# Patient Record
Sex: Male | Born: 1945 | Race: White | Hispanic: No | Marital: Single | Smoking: Never smoker
Health system: Southern US, Community
[De-identification: ages and names within clinical notes are randomized; demographics above are authoritative.]

## PROBLEM LIST (undated history)

## (undated) DIAGNOSIS — M8580 Other specified disorders of bone density and structure, unspecified site: Secondary | ICD-10-CM

## (undated) DIAGNOSIS — G629 Polyneuropathy, unspecified: Secondary | ICD-10-CM

## (undated) DIAGNOSIS — U071 COVID-19: Secondary | ICD-10-CM

## (undated) DIAGNOSIS — F419 Anxiety disorder, unspecified: Secondary | ICD-10-CM

## (undated) DIAGNOSIS — O2230 Deep phlebothrombosis in pregnancy, unspecified trimester: Secondary | ICD-10-CM

## (undated) DIAGNOSIS — C4491 Basal cell carcinoma of skin, unspecified: Secondary | ICD-10-CM

## (undated) DIAGNOSIS — F32A Depression, unspecified: Secondary | ICD-10-CM

## (undated) DIAGNOSIS — K219 Gastro-esophageal reflux disease without esophagitis: Secondary | ICD-10-CM

## (undated) DIAGNOSIS — M1990 Unspecified osteoarthritis, unspecified site: Secondary | ICD-10-CM

## (undated) DIAGNOSIS — C210 Malignant neoplasm of anus, unspecified: Secondary | ICD-10-CM

## (undated) DIAGNOSIS — Z85048 Personal history of other malignant neoplasm of rectum, rectosigmoid junction, and anus: Secondary | ICD-10-CM

## (undated) DIAGNOSIS — B20 Human immunodeficiency virus [HIV] disease: Secondary | ICD-10-CM

## (undated) DIAGNOSIS — L299 Pruritus, unspecified: Secondary | ICD-10-CM

## (undated) DIAGNOSIS — G4733 Obstructive sleep apnea (adult) (pediatric): Secondary | ICD-10-CM

## (undated) DIAGNOSIS — A630 Anogenital (venereal) warts: Secondary | ICD-10-CM

## (undated) DIAGNOSIS — I10 Essential (primary) hypertension: Secondary | ICD-10-CM

## (undated) DIAGNOSIS — Z21 Asymptomatic human immunodeficiency virus [HIV] infection status: Secondary | ICD-10-CM

## (undated) DIAGNOSIS — K224 Dyskinesia of esophagus: Secondary | ICD-10-CM

## (undated) DIAGNOSIS — E785 Hyperlipidemia, unspecified: Secondary | ICD-10-CM

## (undated) HISTORY — PX: WISDOM TOOTH EXTRACTION: SHX21

## (undated) HISTORY — PX: COLONOSCOPY: SHX174

## (undated) HISTORY — PX: TONSILLECTOMY: SUR1361

## (undated) HISTORY — PX: ANUS SURGERY: SHX302

## (undated) HISTORY — PX: UPPER GASTROINTESTINAL ENDOSCOPY: SHX188

## (undated) HISTORY — PX: ADENOIDECTOMY: SUR15

---

## 2005-12-05 ENCOUNTER — Emergency Department: Payer: Self-pay | Admitting: Internal Medicine

## 2011-02-07 ENCOUNTER — Emergency Department: Payer: Self-pay | Admitting: Emergency Medicine

## 2011-06-11 DIAGNOSIS — Z85828 Personal history of other malignant neoplasm of skin: Secondary | ICD-10-CM | POA: Insufficient documentation

## 2011-09-24 DIAGNOSIS — A609 Anogenital herpesviral infection, unspecified: Secondary | ICD-10-CM | POA: Insufficient documentation

## 2013-01-30 DIAGNOSIS — R768 Other specified abnormal immunological findings in serum: Secondary | ICD-10-CM | POA: Insufficient documentation

## 2014-04-05 DIAGNOSIS — M26629 Arthralgia of temporomandibular joint, unspecified side: Secondary | ICD-10-CM | POA: Insufficient documentation

## 2014-04-05 DIAGNOSIS — Z23 Encounter for immunization: Secondary | ICD-10-CM | POA: Insufficient documentation

## 2015-03-11 DIAGNOSIS — D649 Anemia, unspecified: Secondary | ICD-10-CM | POA: Insufficient documentation

## 2015-04-17 DIAGNOSIS — G4731 Primary central sleep apnea: Secondary | ICD-10-CM | POA: Insufficient documentation

## 2015-10-16 DIAGNOSIS — R5382 Chronic fatigue, unspecified: Secondary | ICD-10-CM | POA: Insufficient documentation

## 2015-12-03 DIAGNOSIS — C4361 Malignant melanoma of right upper limb, including shoulder: Secondary | ICD-10-CM | POA: Insufficient documentation

## 2017-04-02 ENCOUNTER — Ambulatory Visit (INDEPENDENT_AMBULATORY_CARE_PROVIDER_SITE_OTHER): Payer: Medicare Other | Admitting: Podiatry

## 2017-04-02 ENCOUNTER — Ambulatory Visit (INDEPENDENT_AMBULATORY_CARE_PROVIDER_SITE_OTHER): Payer: Medicare Other

## 2017-04-02 DIAGNOSIS — F32A Depression, unspecified: Secondary | ICD-10-CM | POA: Insufficient documentation

## 2017-04-02 DIAGNOSIS — Z9181 History of falling: Secondary | ICD-10-CM | POA: Diagnosis not present

## 2017-04-02 DIAGNOSIS — M47812 Spondylosis without myelopathy or radiculopathy, cervical region: Secondary | ICD-10-CM | POA: Insufficient documentation

## 2017-04-02 DIAGNOSIS — I10 Essential (primary) hypertension: Secondary | ICD-10-CM | POA: Insufficient documentation

## 2017-04-02 DIAGNOSIS — G5792 Unspecified mononeuropathy of left lower limb: Secondary | ICD-10-CM | POA: Diagnosis not present

## 2017-04-02 DIAGNOSIS — K219 Gastro-esophageal reflux disease without esophagitis: Secondary | ICD-10-CM | POA: Insufficient documentation

## 2017-04-02 DIAGNOSIS — E785 Hyperlipidemia, unspecified: Secondary | ICD-10-CM | POA: Insufficient documentation

## 2017-04-02 DIAGNOSIS — M779 Enthesopathy, unspecified: Secondary | ICD-10-CM

## 2017-04-02 DIAGNOSIS — F329 Major depressive disorder, single episode, unspecified: Secondary | ICD-10-CM | POA: Insufficient documentation

## 2017-04-02 DIAGNOSIS — M8580 Other specified disorders of bone density and structure, unspecified site: Secondary | ICD-10-CM | POA: Insufficient documentation

## 2017-04-02 DIAGNOSIS — G629 Polyneuropathy, unspecified: Secondary | ICD-10-CM | POA: Insufficient documentation

## 2017-04-02 DIAGNOSIS — R2681 Unsteadiness on feet: Secondary | ICD-10-CM | POA: Diagnosis not present

## 2017-04-02 DIAGNOSIS — Z21 Asymptomatic human immunodeficiency virus [HIV] infection status: Secondary | ICD-10-CM | POA: Insufficient documentation

## 2017-04-04 NOTE — Progress Notes (Signed)
   HPI: 71 year old male presenting today as a new patient with a complaint of stinging pain to the lateral side of the left foot that began 2-3 weeks ago. There are no modifying factors noted. He has not done anything to treat the symptoms. He is here for further evaluation and treatment.   No past medical history on file.   Physical Exam: General: The patient is alert and oriented x3 in no acute distress.  Dermatology: Skin is warm, dry and supple bilateral lower extremities. Negative for open lesions or macerations.  Vascular: Palpable pedal pulses bilaterally. No edema or erythema noted. Capillary refill within normal limits.  Neurological: Positive Tinel sign with percussion of the sural nerve of the left foot. Epicritic and protective threshold grossly intact bilaterally.   Musculoskeletal Exam: Range of motion within normal limits to all pedal and ankle joints bilateral. Muscle strength 5/5 in all groups bilateral.   Radiographic Exam:  Normal osseous mineralization. Joint spaces preserved. No fracture/dislocation/boney destruction.    Assessment: - Neuritis left lateral foot - Gait instability bilaterally - History of falls secondary to imbalance   Plan of Care:  - Patient evaluated. X-rays reviewed. - Injection of 0.5 mLs Celestone Soluspan injected into the left lateral foot near the sural nerve. - Appointment with Liliane Channel for [employer] bracing. - Continue taking gabapentin as directed by PCP. - Return to clinic when necessary.   Edrick Kins, DPM [facility]  Dr. Edrick Kins, DPM    [address],  [address]                Office [phone]  Fax [phone]

## 2017-04-07 MED ORDER — BETAMETHASONE SOD PHOS & ACET 6 (3-3) MG/ML IJ SUSP: 3.0000 mg | Freq: Once | INTRAMUSCULAR | Status: AC

## 2017-04-21 ENCOUNTER — Other Ambulatory Visit: Payer: Self-pay | Admitting: Orthotics

## 2017-06-15 ENCOUNTER — Ambulatory Visit: Payer: Medicare Other | Admitting: Podiatry

## 2019-08-24 ENCOUNTER — Ambulatory Visit: Payer: Self-pay | Attending: Internal Medicine

## 2019-08-24 DIAGNOSIS — Z23 Encounter for immunization: Secondary | ICD-10-CM

## 2019-08-24 NOTE — Progress Notes (Signed)
   Covid-19 Vaccination Clinic  Name:  Jordan Cohen    MRN: [id number] DOB: May 11, 1946  08/24/2019  [name] was observed post Covid-19 immunization for 15 minutes without incident. He was provided with Vaccine Information Sheet and instruction to access the V-Safe system.   [name] was instructed to call 911 with any severe reactions post vaccine: Marland Kitchen Difficulty breathing  . Swelling of face and throat  . A fast heartbeat  . A bad rash all over body  . Dizziness and weakness   Immunizations Administered    Name Date Dose VIS Date Route   Pfizer COVID-19 Vaccine 08/24/2019 10:40 AM 0.3 mL 05/19/2019 Intramuscular   Manufacturer: [place]   Lot: [id number]   [place]: [id number]

## 2019-09-18 ENCOUNTER — Ambulatory Visit: Payer: Self-pay | Attending: Internal Medicine

## 2019-09-18 DIAGNOSIS — Z23 Encounter for immunization: Secondary | ICD-10-CM

## 2019-09-18 NOTE — Progress Notes (Signed)
   Covid-19 Vaccination Clinic  Name:  Jordan Cohen    MRN: [id number] DOB: [birth year]  09/18/2019  Mr. Jordan Cohen was observed post Covid-19 immunization for 15 minutes without incident. He was provided with Vaccine Information Sheet and instruction to access the V-Safe system.   Mr. Jordan Cohen was instructed to call 911 with any severe reactions post vaccine: Marland Kitchen Difficulty breathing  . Swelling of face and throat  . A fast heartbeat  . A bad rash all over body  . Dizziness and weakness   Immunizations Administered    Name Date Dose VIS Date Route   Pfizer COVID-19 Vaccine 09/18/2019 10:49 AM 0.3 mL 05/19/2019 Intramuscular   Manufacturer: [place]   Lot: [id number]   [place]: [id number]

## 2019-09-24 ENCOUNTER — Encounter: Payer: Self-pay | Admitting: Emergency Medicine

## 2019-09-24 ENCOUNTER — Other Ambulatory Visit: Payer: Self-pay

## 2019-09-24 ENCOUNTER — Emergency Department
Admission: EM | Admit: 2019-09-24 | Discharge: 2019-09-24 | Disposition: A | Discharge status: Home / Self Care | Payer: Medicare Other | Attending: Emergency Medicine | Admitting: Emergency Medicine

## 2019-09-24 ENCOUNTER — Emergency Department: Payer: Medicare Other

## 2019-09-24 DIAGNOSIS — Z85048 Personal history of other malignant neoplasm of rectum, rectosigmoid junction, and anus: Secondary | ICD-10-CM | POA: Insufficient documentation

## 2019-09-24 DIAGNOSIS — Z85828 Personal history of other malignant neoplasm of skin: Secondary | ICD-10-CM | POA: Insufficient documentation

## 2019-09-24 DIAGNOSIS — I10 Essential (primary) hypertension: Secondary | ICD-10-CM | POA: Insufficient documentation

## 2019-09-24 DIAGNOSIS — R0789 Other chest pain: Secondary | ICD-10-CM | POA: Diagnosis not present

## 2019-09-24 DIAGNOSIS — Z79899 Other long term (current) drug therapy: Secondary | ICD-10-CM | POA: Insufficient documentation

## 2019-09-24 DIAGNOSIS — F419 Anxiety disorder, unspecified: Secondary | ICD-10-CM | POA: Insufficient documentation

## 2019-09-24 DIAGNOSIS — R079 Chest pain, unspecified: Secondary | ICD-10-CM

## 2019-09-24 HISTORY — DX: Asymptomatic human immunodeficiency virus (hiv) infection status: Z21

## 2019-09-24 HISTORY — DX: Human immunodeficiency virus (HIV) disease: B20

## 2019-09-24 HISTORY — DX: Personal history of other malignant neoplasm of rectum, rectosigmoid junction, and anus: Z85.048

## 2019-09-24 LAB — CBC
HCT: 39.4 % (ref 39.0–52.0)
Hemoglobin: 13.2 g/dL (ref 13.0–17.0)
MCH: 32 pg (ref 26.0–34.0)
MCHC: 33.5 g/dL (ref 30.0–36.0)
MCV: 95.4 fL (ref 80.0–100.0)
Platelets: 250 10*3/uL (ref 150–400)
RBC: 4.13 MIL/uL — ABNORMAL LOW (ref 4.22–5.81)
RDW: 12.5 % (ref 11.5–15.5)
WBC: 5.8 10*3/uL (ref 4.0–10.5)
nRBC: 0 % (ref 0.0–0.2)

## 2019-09-24 LAB — BASIC METABOLIC PANEL
Anion gap: 5 (ref 5–15)
BUN: 16 mg/dL (ref 8–23)
CO2: 29 mmol/L (ref 22–32)
Calcium: 8.9 mg/dL (ref 8.9–10.3)
Chloride: 102 mmol/L (ref 98–111)
Creatinine, Ser: 1.21 mg/dL (ref 0.61–1.24)
GFR calc Af Amer: >60 mL/min (ref >60)
GFR calc non Af Amer: 60 mL/min — ABNORMAL LOW (ref >60)
Glucose, Bld: 88 mg/dL (ref 70–99)
Potassium: 4.9 mmol/L (ref 3.5–5.1)
Sodium: 136 mmol/L (ref 135–145)

## 2019-09-24 LAB — TROPONIN I (HIGH SENSITIVITY)
Troponin I (High Sensitivity): 3 ng/L (ref <18)
Troponin I (High Sensitivity): 3 ng/L (ref <18)

## 2019-09-24 MED ORDER — LORAZEPAM 0.5 MG PO TABS
0.5000 mg | ORAL_TABLET | Freq: Once | ORAL | Status: AC
  Administered 2019-09-24: 0.5 mg via ORAL
  Filled 2019-09-24: qty 1

## 2019-09-24 NOTE — ED Triage Notes (Signed)
Here for central chest tightness and palpitations.  Reports feeling like hard to get good breath.  Unlabored. VSS. No cough or fever.  Is HIV positive.  NAD. Ambulatory.

## 2019-09-24 NOTE — ED Provider Notes (Signed)
[facility] Emergency Department Provider Note    ____________________________________________   I have reviewed the triage vital signs and the nursing notes.   HISTORY  Chief Complaint Chest discomfort  History limited by: Not Limited   HPI Jordan Cohen is a 74 y.o. male who presents to the emergency department today because of concerns for chest discomfort.  Patient describes it as a sensation of an expansion in his chest.  This started this morning.  He says that for the past few nights he has not been able to sleep.  He states that he has been having keep above.  This will occur throughout his body and randomly.  He denies similar symptoms in the past.  Denies any new medications or change in medications.  States he has been eating and drinking his normal amount.   Records reviewed. Per medical record review patient has a history of HTN, HIV  Past Medical History:  Diagnosis Date  . History of anal cancer   . HIV (human immunodeficiency virus infection) [facility])     Patient Active Problem List   Diagnosis Date Noted  . 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 Asymptomatic HIV infection ([place]) 04/02/2017  . Depression 04/02/2017  . GERD (gastroesophageal reflux disease) 04/02/2017  . HTN (hypertension) 04/02/2017  . Hyperlipidemia, unspecified 04/02/2017  . Osteopenia 04/02/2017  . Peripheral neuropathy 04/02/2017  . Melanoma of shoulder, right ([place]) 12/03/2015  . Chronic fatigue, unspecified 10/16/2015  . 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 Anemia, unspecified 03/11/2015  . Needs flu shot 04/05/2014  . Temporomandibular joint (TMJ) pain 04/05/2014  . Helicobacter pylori antibody positive 01/30/2013  . HSV (herpes simplex virus) anogenital infection 09/24/2011  . Personal history of other malignant neoplasm of skin 06/11/2011  . 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 ANUS SURGERY     . TONSILLECTOMY      Prior to Admission medications   Medication Sig Start Date End Date Taking? Authorizing Provider  acyclovir (ZOVIRAX) 400 MG tablet  03/05/17   [provider]  buPROPion (WELLBUTRIN XL) 300 MG 24 hr tablet  03/28/17   [provider]  Calcium Carbonate-Vitamin D (CALCIUM HIGH POTENCY/VITAMIN D) 600-200 MG-UNIT TABS take 1 tablet by mouth twice a day with meals 08/02/13   [provider]  Clobetasol Propionate 0.05 % shampoo [place] SCALP THREE TIMES A WEEK AS DIRECTED 09/17/15   [provider]  diazepam (VALIUM) 5 MG tablet Take by mouth. 03/02/16   [provider]  esomeprazole (NEXIUM) 40 MG capsule take 1 capsule by mouth twice a day 03/08/17   [provider]  gabapentin (NEURONTIN) 300 MG capsule  03/05/17   [provider]  loperamide (IMODIUM) 2 MG capsule Take by mouth. 04/18/13   [provider]  losartan (COZAAR) 50 MG tablet Take by mouth. 09/14/16 09/14/17  [provider]  Multiple Vitamin (MULTIVITAMIN) capsule Take by mouth.    [provider]  pravastatin (PRAVACHOL) 40 MG tablet  03/05/17   [provider]  raltegravir (ISENTRESS) 400 MG tablet take 1 tablet by mouth twice a day 03/23/17   [provider]  sertraline (ZOLOFT) 100 MG tablet  03/21/17   [provider]  TRUVADA 200-300 MG tablet  02/10/17   [provider]  VALSARTAN PO Take by mouth.    [provider]    Allergies Tetracycline, Atorvastatin, and Efavirenz-emtricitab-tenofovir  History reviewed.  No pertinent family history.  Social History Social History   Tobacco Use  . Smoking status: Never Smoker  . Smokeless tobacco: Never Used  Substance Use Topics  . Alcohol use: Never  . Drug use: Never    Review of Systems Constitutional: No fever/chills Eyes: No visual changes. ENT: No sore throat. Cardiovascular: Positive for chest discomfort.   Respiratory: Denies shortness of breath. Gastrointestinal: No abdominal pain.  No nausea, no vomiting.  No diarrhea.   Genitourinary: Negative for dysuria. Musculoskeletal: Negative for back pain. Skin: Negative for rash. Neurological: Positive for spasms.  ____________________________________________   PHYSICAL EXAM:  VITAL SIGNS: ED Triage Vitals  Enc Vitals Group     BP 09/24/19 0737 136/82     Pulse Rate 09/24/19 0737 83     Resp 09/24/19 0737 16     Temp 09/24/19 0737 97.7 F (36.5 C)     Temp Source 09/24/19 0737 Oral     SpO2 09/24/19 0737 100 %     Weight 09/24/19 0738 180 lb (81.6 kg)     Height 09/24/19 0738 5\' 7"  (1.702 m)     Head Circumference --      Peak Flow --      Pain Score 09/24/19 0738 5   Constitutional: Alert and oriented.  Eyes: Conjunctivae are normal.  ENT      Head: Normocephalic and atraumatic.      Nose: No congestion/rhinnorhea.      Mouth/Throat: Mucous membranes are moist.      Neck: No stridor. Hematological/Lymphatic/Immunilogical: No cervical lymphadenopathy. Cardiovascular: Normal rate, regular rhythm.  No murmurs, rubs, or gallops.  Respiratory: Normal respiratory effort without tachypnea nor retractions. Breath sounds are clear and equal bilaterally. No wheezes/rales/rhonchi. Gastrointestinal: Soft and non tender. No rebound. No guarding.  Genitourinary: Deferred Musculoskeletal: Normal range of motion in all extremities. No lower extremity edema. Neurologic:  Normal speech and language. No gross focal neurologic deficits are appreciated.  Skin:  Skin is warm, dry and intact. No rash noted. Psychiatric: Mood and affect are normal. Speech and behavior are normal. Patient exhibits appropriate insight and judgment.  ____________________________________________    LABS (pertinent positives/negatives)  Trop hs 3 x 2 BMP na 136, k 4.9, glu 88 CBC wbc 5.8, hgb 13.2, plt  250  ____________________________________________   EKG  I, Nance Pear, attending physician, personally viewed and interpreted this EKG  EKG Time: 0733 Rate: 85 Rhythm: normal sinus rhythm Axis: normal Intervals: qtc 461 QRS: narrow, LVH ST changes: no st elevation Impression: abnormal ekg   ____________________________________________    RADIOLOGY  CXR No acute abnormality  ____________________________________________   PROCEDURES  Procedures  ____________________________________________   INITIAL IMPRESSION / ASSESSMENT AND PLAN / ED COURSE  Pertinent labs & imaging results that were available during my care of the patient were reviewed by me and considered in my medical decision making (see chart for details).   Patient presented to the emergency department today because of concern for chest discomfort which he describes an expansion of his chest.  The patient states he also has not been sleeping well the past 2 nights.  Work-up with negative troponins x2.  At this point I think ACS unlikely.  Chest x-ray without pneumonia or pneumothorax.  Doubt PE or dissection given nature of patient's pain and reassuring vital signs and physical exam.  He stated he did feel somewhat better after Ativan.  Do wonder if part of patient's presentation is secondary to some anxiety.  He does have  prescription for Valium.  Discussed with patient portance of primary care follow-up.  ____________________________________________   FINAL CLINICAL IMPRESSION(S) / ED DIAGNOSES  Final diagnoses:  Nonspecific chest pain  Anxiety     Note: This dictation was prepared with Dragon dictation. Any transcriptional errors that result from this process are unintentional     Nance Pear, MD 09/24/19 1159

## 2019-09-24 NOTE — Discharge Instructions (Addendum)
Please take the valium prescribed to you to help you sleep. Please seek medical attention for any high fevers, chest pain, shortness of breath, change in behavior, persistent vomiting, bloody stool or any other new or concerning symptoms.

## 2019-09-24 NOTE — ED Notes (Addendum)
Pt presents to the ED c/o "spasms." Pt states they started 2 days ago. Pt states, "it feels like my nerves are moving." Pt also c/o centralized chest pain that started this am but denies SOB or NV. Pt states he can't sleep at night because of it.

## 2019-09-24 NOTE — ED Notes (Signed)
Pt ambulatory to restroom without assistance. Urine sample collected.

## 2021-07-02 ENCOUNTER — Encounter: Payer: Self-pay | Admitting: Internal Medicine

## 2021-07-02 ENCOUNTER — Encounter
Admission: RE | Disposition: A | Discharge status: Home / Self Care | Payer: Self-pay | Source: Home / Self Care | Attending: Internal Medicine

## 2021-07-02 ENCOUNTER — Other Ambulatory Visit: Payer: Self-pay

## 2021-07-02 ENCOUNTER — Ambulatory Visit: Payer: Medicare Other | Admitting: Anesthesiology

## 2021-07-02 ENCOUNTER — Ambulatory Visit
Admission: RE | Admit: 2021-07-02 | Discharge: 2021-07-02 | Disposition: A | Discharge status: Home / Self Care | Payer: Medicare Other | Attending: Internal Medicine | Admitting: Internal Medicine

## 2021-07-02 DIAGNOSIS — F32A Depression, unspecified: Secondary | ICD-10-CM | POA: Insufficient documentation

## 2021-07-02 DIAGNOSIS — Z7901 Long term (current) use of anticoagulants: Secondary | ICD-10-CM | POA: Diagnosis not present

## 2021-07-02 DIAGNOSIS — R000 Tachycardia, unspecified: Secondary | ICD-10-CM | POA: Insufficient documentation

## 2021-07-02 DIAGNOSIS — E782 Mixed hyperlipidemia: Secondary | ICD-10-CM | POA: Diagnosis not present

## 2021-07-02 DIAGNOSIS — I10 Essential (primary) hypertension: Secondary | ICD-10-CM | POA: Insufficient documentation

## 2021-07-02 DIAGNOSIS — M1990 Unspecified osteoarthritis, unspecified site: Secondary | ICD-10-CM | POA: Insufficient documentation

## 2021-07-02 DIAGNOSIS — C210 Malignant neoplasm of anus, unspecified: Secondary | ICD-10-CM

## 2021-07-02 DIAGNOSIS — K219 Gastro-esophageal reflux disease without esophagitis: Secondary | ICD-10-CM | POA: Diagnosis not present

## 2021-07-02 DIAGNOSIS — Z85048 Personal history of other malignant neoplasm of rectum, rectosigmoid junction, and anus: Secondary | ICD-10-CM | POA: Insufficient documentation

## 2021-07-02 DIAGNOSIS — I480 Paroxysmal atrial fibrillation: Secondary | ICD-10-CM | POA: Insufficient documentation

## 2021-07-02 DIAGNOSIS — Z21 Asymptomatic human immunodeficiency virus [HIV] infection status: Secondary | ICD-10-CM | POA: Diagnosis not present

## 2021-07-02 DIAGNOSIS — F419 Anxiety disorder, unspecified: Secondary | ICD-10-CM | POA: Diagnosis not present

## 2021-07-02 HISTORY — PX: CARDIOVERSION: SHX1299

## 2021-07-02 HISTORY — DX: Other specified disorders of bone density and structure, unspecified site: M85.80

## 2021-07-02 HISTORY — DX: Dyskinesia of esophagus: K22.4

## 2021-07-02 HISTORY — DX: Basal cell carcinoma of skin, unspecified: C44.91

## 2021-07-02 HISTORY — DX: Anxiety disorder, unspecified: F41.9

## 2021-07-02 HISTORY — DX: Polyneuropathy, unspecified: G62.9

## 2021-07-02 HISTORY — DX: Gastro-esophageal reflux disease without esophagitis: K21.9

## 2021-07-02 HISTORY — DX: Obstructive sleep apnea (adult) (pediatric): G47.33

## 2021-07-02 HISTORY — DX: Human immunodeficiency virus (HIV) disease: B20

## 2021-07-02 HISTORY — DX: Anogenital (venereal) warts: A63.0

## 2021-07-02 HISTORY — DX: Hyperlipidemia, unspecified: E78.5

## 2021-07-02 HISTORY — DX: Depression, unspecified: F32.A

## 2021-07-02 HISTORY — DX: Malignant neoplasm of anus, unspecified: C21.0

## 2021-07-02 HISTORY — DX: COVID-19: U07.1

## 2021-07-02 HISTORY — DX: Pruritus, unspecified: L29.9

## 2021-07-02 HISTORY — DX: Unspecified osteoarthritis, unspecified site: M19.90

## 2021-07-02 HISTORY — DX: Essential (primary) hypertension: I10

## 2021-07-02 HISTORY — DX: Deep phlebothrombosis in pregnancy, unspecified trimester: O22.30

## 2021-07-02 SURGERY — CARDIOVERSION
Anesthesia: General

## 2021-07-02 MED ORDER — PROPOFOL 10 MG/ML IV BOLUS
INTRAVENOUS | Status: AC
  Filled 2021-07-02: qty 20

## 2021-07-02 MED ORDER — SODIUM CHLORIDE 0.9 % IV SOLN: INTRAVENOUS | Status: DC

## 2021-07-02 MED ORDER — PHENYLEPHRINE HCL-NACL 20-0.9 MG/250ML-% IV SOLN
INTRAVENOUS | Status: AC
  Filled 2021-07-02: qty 250

## 2021-07-02 MED ORDER — PROPOFOL 10 MG/ML IV BOLUS
INTRAVENOUS | Status: DC | PRN
  Administered 2021-07-02: 60 mg via INTRAVENOUS

## 2021-07-02 NOTE — CV Procedure (Signed)
Electrical Cardioversion Procedure Note Jordan Cohen [id number] 07-02-1945  Procedure: Electrical Cardioversion Indications:  Paroxysmal non valvular atrial fibrillation  Procedure Details Consent: Risks of procedure as well as the alternatives and risks of each were explained to the (patient/caregiver).  Consent for procedure obtained. Time Out: Verified patient identification, verified procedure, site/side was marked, verified correct patient position, special equipment/implants available, medications/allergies/relevent history reviewed, required imaging and test results available.  Performed  Patient placed on cardiac monitor, pulse oximetry, supplemental oxygen as necessary.  Sedation given: Propofol and versed as per anesthesia  Pacer pads placed anterior and posterior chest.  Cardioverted 1 time(s).  Cardioverted at 120J.  Evaluation Findings: Post procedure EKG shows: NSR Complications: None Patient did tolerate procedure well.   Jordan Cohen M.D. [facility] 07/02/2021, 7:57 AM

## 2021-07-02 NOTE — Anesthesia Preprocedure Evaluation (Signed)
Anesthesia Evaluation  Patient identified by MRN, date of birth, ID band Patient awake    Reviewed: Allergy & Precautions, NPO status , Patient's Chart, lab work & pertinent test results  History of Anesthesia Complications Negative for: history of anesthetic complications  Airway Mallampati: III  TM Distance: >3 FB Neck ROM: full    Dental  (+) Chipped, Poor Dentition, Missing   Pulmonary neg pulmonary ROS, neg shortness of breath,    Pulmonary exam normal        Cardiovascular Exercise Tolerance: Good hypertension, (-) angina+ dysrhythmias Atrial Fibrillation  Rhythm:irregular Rate:Tachycardia     Neuro/Psych PSYCHIATRIC DISORDERS  Neuromuscular disease    GI/Hepatic Neg liver ROS, GERD  Controlled,  Endo/Other  negative endocrine ROS  Renal/GU negative Renal ROS  negative genitourinary   Musculoskeletal  (+) Arthritis ,   Abdominal   Peds  Hematology negative hematology ROS (+)   Anesthesia Other Findings Past Medical History: No date: Anxiety No date: Depression No date: History of anal cancer No date: HIV (human immunodeficiency virus infection) (HCC) No date: Hyperlipidemia No date: Hypertension  Past Surgical History: No date: ANUS SURGERY No date: TONSILLECTOMY     Reproductive/Obstetrics negative OB ROS                             Anesthesia Physical Anesthesia Plan  ASA: 4  Anesthesia Plan: General   Post-op Pain Management:    Induction: Intravenous  PONV Risk Score and Plan: Propofol infusion and TIVA  Airway Management Planned: Natural Airway and Nasal Cannula  Additional Equipment:   Intra-op Plan:   Post-operative Plan:   Informed Consent: I have reviewed the patients History and Physical, chart, labs and discussed the procedure including the risks, benefits and alternatives for the proposed anesthesia with the patient or authorized representative  who has indicated his/her understanding and acceptance.     Dental Advisory Given  Plan Discussed with: Anesthesiologist, CRNA and Surgeon  Anesthesia Plan Comments: (Patient consented for risks of anesthesia including but not limited to:  - adverse reactions to medications - risk of airway placement if required - damage to eyes, teeth, lips or other oral mucosa - nerve damage due to positioning  - sore throat or hoarseness - Damage to heart, brain, nerves, lungs, other parts of body or loss of life  Patient voiced understanding.)        Anesthesia Quick Evaluation

## 2021-07-02 NOTE — Transfer of Care (Signed)
Immediate Anesthesia Transfer of Care Note  Patient: Jordan Cohen  Procedure(s) Performed: CARDIOVERSION  Patient Location: PACU and special recoveries  Anesthesia Type:General  Level of Consciousness: drowsy and patient cooperative  Airway & Oxygen Therapy: Patient Spontanous Breathing and Patient connected to nasal cannula oxygen  Post-op Assessment: Report given to RN and Post -op Vital signs reviewed and stable  Post vital signs: Reviewed and stable  Last Vitals:  Vitals Value Taken Time  BP 127/80 07/02/21 0735  Temp    Pulse 60 07/02/21 0738  Resp 18 07/02/21 0738  SpO2 98 % 07/02/21 0738    Last Pain:  Vitals:   07/02/21 0717  TempSrc: Oral  PainSc: 0-No pain         Complications: No notable events documented.

## 2021-07-02 NOTE — Anesthesia Procedure Notes (Signed)
Procedure Name: MAC Date/Time: 07/02/2021 7:27 AM Performed by: Jerrye Noble, CRNA Pre-anesthesia Checklist: Patient identified, Emergency Drugs available, Suction available and Patient being monitored Patient Re-evaluated:Patient Re-evaluated prior to induction Oxygen Delivery Method: Nasal cannula

## 2021-07-02 NOTE — Anesthesia Postprocedure Evaluation (Signed)
Anesthesia Post Note  Patient: Adarryl Goldammer  Procedure(s) Performed: CARDIOVERSION  Patient location during evaluation: Cath Lab Anesthesia Type: General Level of consciousness: awake and alert Pain management: pain level controlled Vital Signs Assessment: post-procedure vital signs reviewed and stable Respiratory status: spontaneous breathing, nonlabored ventilation, respiratory function stable and patient connected to nasal cannula oxygen Cardiovascular status: blood pressure returned to baseline and stable Postop Assessment: no apparent nausea or vomiting Anesthetic complications: no   No notable events documented.   Last Vitals:  Vitals:   07/02/21 0800 07/02/21 0810  BP: 124/72 135/84  Pulse: 60 60  Resp: 13 14  Temp:    SpO2: 96% 95%    Last Pain:  Vitals:   07/02/21 0717  TempSrc: Oral  PainSc: 0-No pain                 Precious Haws Betzalel Umbarger

## 2021-07-03 ENCOUNTER — Encounter: Payer: Self-pay | Admitting: Internal Medicine

## 2021-11-24 ENCOUNTER — Emergency Department: Payer: Medicare Other

## 2021-11-24 ENCOUNTER — Encounter: Payer: Self-pay | Admitting: Emergency Medicine

## 2021-11-24 ENCOUNTER — Other Ambulatory Visit: Payer: Self-pay

## 2021-11-24 ENCOUNTER — Ambulatory Visit
Admission: EM | Admit: 2021-11-24 | Discharge: 2021-11-24 | Disposition: A | Discharge status: Home / Self Care | Payer: Medicare Other | Attending: Family Medicine | Admitting: Family Medicine

## 2021-11-24 ENCOUNTER — Emergency Department
Admission: EM | Admit: 2021-11-24 | Discharge: 2021-11-24 | Disposition: A | Discharge status: Home / Self Care | Payer: Medicare Other | Attending: Emergency Medicine | Admitting: Emergency Medicine

## 2021-11-24 DIAGNOSIS — Z21 Asymptomatic human immunodeficiency virus [HIV] infection status: Secondary | ICD-10-CM | POA: Insufficient documentation

## 2021-11-24 DIAGNOSIS — Z79899 Other long term (current) drug therapy: Secondary | ICD-10-CM | POA: Diagnosis not present

## 2021-11-24 DIAGNOSIS — Z7901 Long term (current) use of anticoagulants: Secondary | ICD-10-CM | POA: Insufficient documentation

## 2021-11-24 DIAGNOSIS — R42 Dizziness and giddiness: Secondary | ICD-10-CM | POA: Diagnosis not present

## 2021-11-24 DIAGNOSIS — R519 Headache, unspecified: Secondary | ICD-10-CM | POA: Diagnosis present

## 2021-11-24 DIAGNOSIS — R2681 Unsteadiness on feet: Secondary | ICD-10-CM | POA: Insufficient documentation

## 2021-11-24 DIAGNOSIS — R269 Unspecified abnormalities of gait and mobility: Secondary | ICD-10-CM

## 2021-11-24 DIAGNOSIS — Z85828 Personal history of other malignant neoplasm of skin: Secondary | ICD-10-CM | POA: Insufficient documentation

## 2021-11-24 DIAGNOSIS — R2689 Other abnormalities of gait and mobility: Secondary | ICD-10-CM

## 2021-11-24 DIAGNOSIS — I4891 Unspecified atrial fibrillation: Secondary | ICD-10-CM | POA: Insufficient documentation

## 2021-11-24 DIAGNOSIS — G44209 Tension-type headache, unspecified, not intractable: Secondary | ICD-10-CM

## 2021-11-24 LAB — COMPREHENSIVE METABOLIC PANEL
ALT: 25 U/L (ref 0–44)
AST: 20 U/L (ref 15–41)
Albumin: 4.1 g/dL (ref 3.5–5.0)
Alkaline Phosphatase: 101 U/L (ref 38–126)
Anion gap: 5 (ref 5–15)
BUN: 19 mg/dL (ref 8–23)
CO2: 28 mmol/L (ref 22–32)
Calcium: 8.9 mg/dL (ref 8.9–10.3)
Chloride: 109 mmol/L (ref 98–111)
Creatinine, Ser: 1.01 mg/dL (ref 0.61–1.24)
GFR, Estimated: >60 mL/min (ref >60)
Glucose, Bld: 98 mg/dL (ref 70–99)
Potassium: 3.8 mmol/L (ref 3.5–5.1)
Sodium: 142 mmol/L (ref 135–145)
Total Bilirubin: 0.8 mg/dL (ref 0.3–1.2)
Total Protein: 7.2 g/dL (ref 6.5–8.1)

## 2021-11-24 LAB — CBC
HCT: 40.1 % (ref 39.0–52.0)
Hemoglobin: 13.3 g/dL (ref 13.0–17.0)
MCH: 31.9 pg (ref 26.0–34.0)
MCHC: 33.2 g/dL (ref 30.0–36.0)
MCV: 96.2 fL (ref 80.0–100.0)
Platelets: 261 10*3/uL (ref 150–400)
RBC: 4.17 MIL/uL — ABNORMAL LOW (ref 4.22–5.81)
RDW: 12.7 % (ref 11.5–15.5)
WBC: 6 10*3/uL (ref 4.0–10.5)
nRBC: 0 % (ref 0.0–0.2)

## 2021-11-24 LAB — TROPONIN I (HIGH SENSITIVITY): Troponin I (High Sensitivity): 5 ng/L (ref <18)

## 2021-11-24 LAB — POCT FASTING CBG KUC MANUAL ENTRY: POCT Glucose (KUC): 111 mg/dL — AB (ref 70–99)

## 2021-11-24 MED ORDER — MECLIZINE HCL 25 MG PO TABS
25.0000 mg | ORAL_TABLET | Freq: Once | ORAL | Status: AC
  Administered 2021-11-24: 25 mg via ORAL
  Filled 2021-11-24: qty 1

## 2021-11-24 MED ORDER — ACETAMINOPHEN 500 MG PO TABS
1000.0000 mg | ORAL_TABLET | Freq: Once | ORAL | Status: AC
  Administered 2021-11-24: 1000 mg via ORAL
  Filled 2021-11-24: qty 2

## 2021-11-24 NOTE — ED Provider Notes (Signed)
-----------------------------------------   7:13 PM on 11/24/2021 -----------------------------------------  I took over care of this patient from Dr. Tamala Julian.  The patient has been waiting for MRI since the time of signout at 3 PM.  There are still a couple of patients ahead of him and in line.  At this time, the patient states that he would like to go home.  He denies any acute symptoms at this time.  I had a thorough discussion with him about risks and benefits.  He understands that without obtaining the MRI we cannot fully rule out acute to subacute stroke which can cause permanent disability, although the clinical suspicion is low.  The patient understands he may return at any time if he changes his mind and wishes to resume ED evaluation.  He states he will call his doctor tomorrow to arrange for an outpatient MRI in the next few days.  I gave him return precautions and he expressed understanding   Arta Silence, MD 11/24/21 1914

## 2021-11-24 NOTE — ED Provider Notes (Signed)
[facility] Provider Note    Event Date/Time   First MD Initiated Contact with Patient 11/24/21 1405     (approximate)   History   Dizziness   HPI  Kaylob Wallen is a 76 y.o. male who presents to the ED for evaluation of Dizziness   I reviewed infectious disease visit from 5/23.  History of HIV on Biktarvy.  Eliquis for A-fib.  Previous squamous cell carcinoma of the anus.  Patient presents to the ED for evaluation of 2 or 3 days of balance issues and difficulty walking straight primarily.  He reports "dizziness" but denies presyncope or classic vertigo.  He reports when he is walking, he often has to hold onto things because he finds himself running into the wall and not walking straight.  Denies any vision changes, sensorium changes, focal weakness or fine motor problems such as when using his phone.  He reports bilateral paraspinal cervical stiffness radiating up over his occiput as well as clenching his jaws and is requesting some Tylenol for headache.  Denies any falls or syncopal episodes, fevers, recent illnesses.  He reports all of his symptoms seemed to start after he was trying to move a heavy washing machine around the house that he is working on renovating.   Physical Exam   Triage Vital Signs: ED Triage Vitals [11/24/21 0958]  Enc Vitals Group     BP (!) 145/94     Pulse Rate 70     Resp 18     Temp 98.2 F (36.8 C)     Temp Source Oral     SpO2 96 %     Weight 178 lb (80.7 kg)     Height '5\' 7"'$  (1.702 m)     Head Circumference      Peak Flow      Pain Score 0     Pain Loc      Pain Edu?      Excl. in [place]?     Most recent vital signs: Vitals:   11/24/21 0958  BP: (!) 145/94  Pulse: 70  Resp: 18  Temp: 98.2 F (36.8 C)  SpO2: 96%    General: Awake, no distress.  CV:  Good peripheral perfusion.  Resp:  Normal effort.  Abd:  No distention.  MSK:  No deformity noted.  Neuro:  No focal deficits appreciated. Cranial  nerves II through XII intact 5/5 strength and sensation in all 4 extremities Other:  Well-appearing and conversational.  No meningismus.   ED Results / Procedures / Treatments   Labs (all labs ordered are listed, but only abnormal results are displayed) Labs Reviewed  CBC - Abnormal; Notable for the following components:      Result Value   RBC 4.17 (*)    All other components within normal limits  COMPREHENSIVE METABOLIC PANEL  URINALYSIS, ROUTINE W REFLEX MICROSCOPIC  TROPONIN I (HIGH SENSITIVITY)  TROPONIN I (HIGH SENSITIVITY)    EKG Sinus rhythm, rate of 70 bpm.  Normal axis.  QTc 481.  No STEMI.  RADIOLOGY CT head interpreted by me without evidence of acute intracranial pathology  Official radiology report(s): CT Head Wo Contrast  Result Date: 11/24/2021 CLINICAL DATA:  Headache, dizziness. EXAM: CT HEAD WITHOUT CONTRAST TECHNIQUE: Contiguous axial images were obtained from the base of the skull through the vertex without intravenous contrast. RADIATION DOSE REDUCTION: This exam was performed according to the departmental dose-optimization program which includes automated exposure control, adjustment of the mA  and/or kV according to patient size and/or use of iterative reconstruction technique. COMPARISON:  None Available. FINDINGS: Brain: No evidence of acute infarction, hemorrhage, hydrocephalus, extra-axial collection or mass lesion/mass effect. Vascular: No hyperdense vessel or unexpected calcification. Skull: Normal. Negative for fracture or focal lesion. Sinuses/Orbits: No acute finding. Other: None. IMPRESSION: No acute intracranial abnormality seen. Electronically Signed   By: Marijo Conception M.D.   On: 11/24/2021 10:26    PROCEDURES and INTERVENTIONS:  Procedures  Medications  acetaminophen (TYLENOL) tablet 1,000 mg (has no administration in time range)  meclizine (ANTIVERT) tablet 25 mg (has no administration in time range)     IMPRESSION / MDM / ASSESSMENT AND  PLAN / ED COURSE  I reviewed the triage vital signs and the nursing notes.  Differential diagnosis includes, but is not limited to, stroke, BPPV, tension headache  {Patient presents with symptoms of an acute illness or injury that is potentially life-threatening.  Fairly healthy 76 year old male presents to the ED with a couple days of unsteadiness.  He look systemically well to me and has a reassuring neurologic examination, but slightly stumbles when standing up and reports he has difficulty walking straight.  He reports some dizziness that he has difficulty elaborating upon.  BPPV is a possibility, but I am concerned about the possibility of stroke as well and he is agreeable to stay for an MRI brain to assess for this.  We will treat his headache for Tylenol and his dizziness with meclizine and reassess.  He is signed out to [employer] to follow-up on this study.  If negative then outpatient management may be reasonable.      FINAL CLINICAL IMPRESSION(S) / ED DIAGNOSES   Final diagnoses:  Unsteadiness  Tension headache  Balance problem     Rx / DC Orders   ED Discharge Orders     None        Note:  This document was prepared using Dragon voice recognition software and may include unintentional dictation errors.   Vladimir Crofts, MD 11/24/21 1444

## 2021-11-24 NOTE — ED Notes (Signed)
Patient is being discharged from the Urgent Care and sent to the Emergency Department via personal vehicle . Per Lavell Anchors NP, patient is in need of higher level of care due to dizziness, off balance, jaw clenching. Patient is aware and verbalizes understanding of plan of care.  Vitals:   11/24/21 0936  BP: (!) 158/89  Pulse: 74  Resp: 18  Temp: 98.3 F (36.8 C)

## 2021-11-24 NOTE — ED Provider Notes (Signed)
Jordan Cohen    CSN: [id number] Arrival date & time: 11/24/21  9562      History   Chief Complaint Chief Complaint  Patient presents with   Dizziness   Jaw Pain   Headache    HPI Jordan Cohen is a 76 y.o. male.   HPI Patient with a complex medical history of HIV, proximal atrial fibrillation, presents today with a 3 days history feeling off balanced, confused, dizziness, and generally unwell. He endorses  feel that his "jaws are clinching" without effort. He has had a headache over the last 3 days which he rates pain as a"2". He endorses taking all of his medication as prescribed.  He denies chest pain, shortness of breath, but endorses feeling "off". He is unaccompanied here at [facility] today. He called his primary doctors office and was advised to go to the emergency department for evaluation however did not want to go. Past Medical History:  Diagnosis Date   AIDS (acquired immune deficiency syndrome) ([place])    Anal warts    Anxiety    Arthritis    Basal cell carcinoma    Brachioradial pruritus    COVID-19    Depression    DVT (deep vein thrombosis) in pregnancy    Esophageal spasm    GERD (gastroesophageal reflux disease)    History of anal cancer    HIV (human immunodeficiency virus infection) ([place])    Hyperlipidemia    Hypertension    OSA (obstructive sleep apnea)    Osteopenia    Peripheral neuropathy    Squamous cell carcinoma of anus (HCC)     Patient Active Problem List   Diagnosis Date Noted   Arthritis of neck 04/02/2017   Asymptomatic HIV infection ([place]) 04/02/2017   Depression 04/02/2017   GERD (gastroesophageal reflux disease) 04/02/2017   HTN (hypertension) 04/02/2017   Hyperlipidemia, unspecified 04/02/2017   Osteopenia 04/02/2017   Peripheral neuropathy 04/02/2017   Melanoma of shoulder, right ([place]) 12/03/2015   Chronic fatigue, unspecified 10/16/2015   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   Anemia, unspecified 03/11/2015   Needs flu  shot 04/05/2014   Temporomandibular joint (TMJ) pain 13/01/6577   Helicobacter pylori antibody positive 01/30/2013   HSV (herpes simplex virus) anogenital infection 09/24/2011   Personal history of other malignant neoplasm of skin 06/11/2011   Squamous cell carcinoma of anus ([place]) 07/10/2007    Past Surgical History:  Procedure Laterality Date   ADENOIDECTOMY     ANUS SURGERY     CARDIOVERSION N/A 07/02/2021   Procedure: CARDIOVERSION;  Surgeon: Corey Skains, MD;  Location: [facility] ORS;  Service: Cardiovascular;  Laterality: N/A;   COLONOSCOPY     TONSILLECTOMY     UPPER GASTROINTESTINAL ENDOSCOPY     WISDOM TOOTH EXTRACTION         Home Medications    Prior to Admission medications   Medication Sig Start Date End Date Taking? Authorizing Provider  acyclovir (ZOVIRAX) 400 MG tablet Take 400 mg by mouth daily. 03/05/17   [provider]  albuterol (VENTOLIN HFA) 108 (90 Base) MCG/ACT inhaler Inhale 2 puffs into the lungs every 6 (six) hours as needed for wheezing or shortness of breath.    [provider]  apixaban (ELIQUIS) 5 MG TABS tablet Take 5 mg by mouth 2 (two) times daily.    [provider]  bictegravir-emtricitabine-tenofovir AF (BIKTARVY) 50-200-25 MG TABS tablet Take 1 tablet by mouth daily.    [provider]  buPROPion (WELLBUTRIN XL)  300 MG 24 hr tablet Take 300 mg by mouth daily. 03/28/17   [provider]  cholecalciferol (VITAMIN D3) 25 MCG (1000 UNIT) tablet Take 1,000 Units by mouth daily.    [provider]  diazepam (VALIUM) 10 MG tablet Take 10 mg by mouth daily as needed for anxiety. 03/02/16   [provider]  diltiazem (TIAZAC) 240 MG 24 hr capsule Take 240 mg by mouth daily.    [provider]  esomeprazole (NEXIUM) 40 MG capsule Take 40 mg by mouth 2 (two) times daily before a meal. 03/08/17   [provider]  flecainide (TAMBOCOR) 100 MG tablet Take 100 mg by mouth 2 (two)  times daily.    [provider]  gabapentin (NEURONTIN) 300 MG capsule Take 300-900 mg by mouth See admin instructions. 300 mg in the morning, 900 mg at bedtime 03/05/17   [provider]  loperamide (IMODIUM) 2 MG capsule Take 2 mg by mouth as needed for diarrhea or loose stools. 04/18/13   [provider]  Multiple Vitamins-Minerals (MULTIVITAMIN WITH MINERALS) tablet Take 1 tablet by mouth daily.    [provider]  pravastatin (PRAVACHOL) 40 MG tablet Take 40 mg by mouth daily. 03/05/17   [provider]  Probiotic Product (PROBIOTIC DAILY PO) Take 1 capsule by mouth daily.    [provider]  venlafaxine XR (EFFEXOR-XR) 150 MG 24 hr capsule Take 150 mg by mouth daily with breakfast.    [provider]    Family History History reviewed. No pertinent family history.  Social History Social History   Tobacco Use   Smoking status: Never   Smokeless tobacco: Never  Vaping Use   Vaping Use: Never used  Substance Use Topics   Alcohol use: Never   Drug use: Never     Allergies   Tetracycline, Atorvastatin, and Efavirenz-emtricitab-tenofo df   Review of Systems Review of Systems Pertinent negatives listed in HPI  Physical Exam Triage Vital Signs ED Triage Vitals  Enc Vitals Group     BP      Pulse      Resp      Temp      Temp src      SpO2      Weight      Height      Head Circumference      Peak Flow      Pain Score      Pain Loc      Pain Edu?      Excl. in [place]?    No data found.  Updated Vital Signs BP (!) 158/89 (BP Location: Left Arm)   Pulse 74   Temp 98.3 F (36.8 C) (Oral)   Resp 18   Visual Acuity Right Eye Distance:   Left Eye Distance:   Bilateral Distance:    Right Eye Near:   Left Eye Near:    Bilateral Near:     Physical Exam Constitutional:      Comments: Chronically ill appearing, appears disheveled.  Cardiovascular:     Rate and Rhythm: Normal rate and regular rhythm.   Pulmonary:     Effort: Pulmonary effort is normal.     Breath sounds: Normal breath sounds.  Musculoskeletal:     Cervical back: Normal range of motion.  Skin:    General: Skin is warm.  Neurological:     Mental Status: He is alert and oriented to person, place, and time. Mental status is at  baseline.     Sensory: No sensory deficit.     Motor: No weakness.     Coordination: Coordination abnormal.     Gait: Gait abnormal.  Psychiatric:        Mood and Affect: Mood is anxious.      UC Treatments / Results  Labs (all labs ordered are listed, but only abnormal results are displayed) Labs Reviewed  POCT FASTING CBG [facility] MANUAL ENTRY - Abnormal; Notable for the following components:      Result Value   POCT Glucose ([facility]) 111 (*)    All other components within normal limits    EKG NSR, Rate 73, No acute ST Changes   Radiology No results found.  Procedures Procedures (including critical care time)  Medications Ordered in UC Medications - No data to display  Initial Impression / Assessment and Plan / UC Course  I have reviewed the triage vital signs and the nursing notes.  Pertinent labs & imaging results that were available during my care of the patient were reviewed by me and considered in my medical decision making (see chart for details).    Dizziness and Giddiness, Abnormal Gait Patient has an obvious gait abnormality, however refuses EMS transport to ER for further work-up an evaluation of symptoms. Patient discharged AMA, although voiced that he will go the [facility] for further work-up and evaluation. ECG, NSR RATE 73, blood glucose stable 111, and vitals stable. Final Clinical Impressions(s) / UC Diagnoses   Final diagnoses:  Dizziness and giddiness  Abnormal gait     Discharge Instructions      Go immediately to the emergency department for further work-up and evaluation of the cause of your symptoms. You have refused Ambulance transport therefore you are  traveling at your risk.  Go immediately to the emergency department as indicated at the address indicated above.    ED Prescriptions   None    PDMP not reviewed this encounter.   Scot Jun, FNP 11/24/21 1004

## 2021-11-24 NOTE — Discharge Instructions (Addendum)
Go immediately to the emergency department for further work-up and evaluation of the cause of your symptoms. You have refused Ambulance transport therefore you are traveling at your risk.  Go immediately to the emergency department as indicated at the address indicated above.

## 2021-11-24 NOTE — ED Notes (Addendum)
RN notified MD that pt was ready to leave and not wait for MRI.

## 2021-11-24 NOTE — ED Triage Notes (Signed)
Pt in with co dizziness since Saturday states does feel disoriented at times. NO hx of the same, states head feels tight with headache. Denies any recent illness.

## 2021-11-24 NOTE — ED Triage Notes (Addendum)
Pt presents with dizziness, he is off balance, HA, jaw clenching and upset stomach x 2 days. He called his PCP and advised to go to the ED.

## 2021-11-24 NOTE — ED Notes (Signed)
Pt DC to home. DC instructions reviewed with all questions answered. Pt verbalizes understanding. Pt ambulatory out of dept via ambulation with steady gait

## 2021-11-24 NOTE — Discharge Instructions (Signed)
Your work-up in the ER is reassuring.  Follow-up with your regular doctor as soon as possible.  You may return at any time if you change your mind and wish to proceed with the MRI in the ED.  You should also return immediately if you develop any new or worsening dizziness, unsteadiness on your feet, difficulty walking, weakness or numbness, difficulty with speech or understanding, vision changes, or any other new or worsening symptoms that concern you.

## 2022-08-31 ENCOUNTER — Other Ambulatory Visit: Payer: Self-pay | Admitting: Podiatry

## 2022-08-31 DIAGNOSIS — M85671 Other cyst of bone, right ankle and foot: Secondary | ICD-10-CM

## 2022-09-09 ENCOUNTER — Ambulatory Visit: Payer: Medicare Other

## 2022-09-15 ENCOUNTER — Ambulatory Visit: Admission: RE | Admit: 2022-09-15 | Payer: Medicare Other | Source: Ambulatory Visit

## 2023-01-30 ENCOUNTER — Emergency Department
Admission: EM | Admit: 2023-01-30 | Discharge: 2023-01-30 | Disposition: A | Discharge status: Home / Self Care | Payer: Medicare HMO | Attending: Emergency Medicine | Admitting: Emergency Medicine

## 2023-01-30 ENCOUNTER — Other Ambulatory Visit: Payer: Self-pay

## 2023-01-30 ENCOUNTER — Emergency Department: Payer: Medicare HMO

## 2023-01-30 DIAGNOSIS — Z21 Asymptomatic human immunodeficiency virus [HIV] infection status: Secondary | ICD-10-CM | POA: Insufficient documentation

## 2023-01-30 DIAGNOSIS — Z1152 Encounter for screening for COVID-19: Secondary | ICD-10-CM | POA: Insufficient documentation

## 2023-01-30 DIAGNOSIS — I10 Essential (primary) hypertension: Secondary | ICD-10-CM | POA: Insufficient documentation

## 2023-01-30 DIAGNOSIS — J189 Pneumonia, unspecified organism: Secondary | ICD-10-CM

## 2023-01-30 DIAGNOSIS — R531 Weakness: Secondary | ICD-10-CM

## 2023-01-30 DIAGNOSIS — J168 Pneumonia due to other specified infectious organisms: Secondary | ICD-10-CM | POA: Insufficient documentation

## 2023-01-30 DIAGNOSIS — R509 Fever, unspecified: Secondary | ICD-10-CM | POA: Diagnosis present

## 2023-01-30 LAB — COMPREHENSIVE METABOLIC PANEL
ALT: 22 U/L (ref 0–44)
AST: 21 U/L (ref 15–41)
Albumin: 3.4 g/dL — ABNORMAL LOW (ref 3.5–5.0)
Alkaline Phosphatase: 106 U/L (ref 38–126)
Anion gap: 10 (ref 5–15)
BUN: 22 mg/dL (ref 8–23)
CO2: 28 mmol/L (ref 22–32)
Calcium: 8.6 mg/dL — ABNORMAL LOW (ref 8.9–10.3)
Chloride: 98 mmol/L (ref 98–111)
Creatinine, Ser: 1.12 mg/dL (ref 0.61–1.24)
GFR, Estimated: >60 mL/min (ref >60)
Glucose, Bld: 89 mg/dL (ref 70–99)
Potassium: 4.2 mmol/L (ref 3.5–5.1)
Sodium: 136 mmol/L (ref 135–145)
Total Bilirubin: 0.6 mg/dL (ref 0.3–1.2)
Total Protein: 6.9 g/dL (ref 6.5–8.1)

## 2023-01-30 LAB — CBC WITH DIFFERENTIAL/PLATELET
Abs Immature Granulocytes: 0.03 10*3/uL (ref 0.00–0.07)
Basophils Absolute: 0.1 10*3/uL (ref 0.0–0.1)
Basophils Relative: 1 %
Eosinophils Absolute: 0.3 10*3/uL (ref 0.0–0.5)
Eosinophils Relative: 4 %
HCT: 37.8 % — ABNORMAL LOW (ref 39.0–52.0)
Hemoglobin: 12.4 g/dL — ABNORMAL LOW (ref 13.0–17.0)
Immature Granulocytes: 0 %
Lymphocytes Relative: 21 %
Lymphs Abs: 1.6 10*3/uL (ref 0.7–4.0)
MCH: 31.6 pg (ref 26.0–34.0)
MCHC: 32.8 g/dL (ref 30.0–36.0)
MCV: 96.4 fL (ref 80.0–100.0)
Monocytes Absolute: 0.6 10*3/uL (ref 0.1–1.0)
Monocytes Relative: 8 %
Neutro Abs: 4.9 10*3/uL (ref 1.7–7.7)
Neutrophils Relative %: 66 %
Platelets: 206 10*3/uL (ref 150–400)
RBC: 3.92 MIL/uL — ABNORMAL LOW (ref 4.22–5.81)
RDW: 12.4 % (ref 11.5–15.5)
WBC: 7.5 10*3/uL (ref 4.0–10.5)
nRBC: 0 % (ref 0.0–0.2)

## 2023-01-30 LAB — SARS CORONAVIRUS 2 BY RT PCR: SARS Coronavirus 2 by RT PCR: NEGATIVE

## 2023-01-30 MED ORDER — LACTATED RINGERS IV BOLUS
1000.0000 mL | Freq: Once | INTRAVENOUS | Status: AC
  Administered 2023-01-30: 1000 mL via INTRAVENOUS

## 2023-01-30 MED ORDER — AMOXICILLIN-POT CLAVULANATE 875-125 MG PO TABS: 1.0000 | ORAL_TABLET | Freq: Two times a day (BID) | ORAL | 0 refills | Status: AC

## 2023-01-30 MED ORDER — AMOXICILLIN-POT CLAVULANATE 875-125 MG PO TABS
1.0000 | ORAL_TABLET | Freq: Once | ORAL | Status: AC
  Administered 2023-01-30: 1 via ORAL
  Filled 2023-01-30: qty 1

## 2023-01-30 NOTE — ED Provider Notes (Signed)
[facility] Provider Note    Event Date/Time   First MD Initiated Contact with Patient 01/30/23 1850     (approximate)   History   Chief Complaint Fever and Back Pain   HPI  Jordan Cohen is a 77 y.o. male with past medical history of hypertension, hyperlipidemia, HIV, and anemia who presents to the ED complaining of bodyaches.  Patient reports that he has been feeling weak and malaised with significant fatigue over the past 3 days.  He has felt feverish with chills and has been taking Tylenol daily with partial relief.  He reports some discomfort in his upper back, denies any falls or other trauma.  He has not had any chest pain, cough, shortness of breath, nausea, vomiting, diarrhea, or dysuria.  He is not aware of any sick contacts.  He reports being compliant with his antiretroviral medication, last CD4 count was 311 in June of this year.     Physical Exam   Triage Vital Signs: ED Triage Vitals  Encounter Vitals Group     BP 01/30/23 1839 (!) 146/106     Systolic BP Percentile --      Diastolic BP Percentile --      Pulse Rate 01/30/23 1839 89     Resp 01/30/23 1839 16     Temp 01/30/23 1839 99.3 F (37.4 C)     Temp Source 01/30/23 1839 Oral     SpO2 01/30/23 1839 94 %     Weight --      Height 01/30/23 1842 5\' 7"  (1.702 m)     Head Circumference --      Peak Flow --      Pain Score 01/30/23 1842 0     Pain Loc --      Pain Education --      Exclude from Growth Chart --     Most recent vital signs: Vitals:   01/30/23 2130 01/30/23 2200  BP: (!) 143/68   Pulse: 77 78  Resp:    Temp:    SpO2: 100% 99%    Constitutional: Alert and oriented. Eyes: Conjunctivae are normal. Head: Atraumatic. Nose: No congestion/rhinnorhea. Mouth/Throat: Mucous membranes are moist.  Neck: Supple with no meningismus. Cardiovascular: Normal rate, regular rhythm. Grossly normal heart sounds.  2+ radial pulses bilaterally. Respiratory: Normal  respiratory effort.  No retractions. Lungs CTAB. Gastrointestinal: Soft and nontender. No distention. Musculoskeletal: No lower extremity tenderness nor edema.  Neurologic:  Normal speech and language. No gross focal neurologic deficits are appreciated.    ED Results / Procedures / Treatments   Labs (all labs ordered are listed, but only abnormal results are displayed) Labs Reviewed  CBC WITH DIFFERENTIAL/PLATELET - Abnormal; Notable for the following components:      Result Value   RBC 3.92 (*)    Hemoglobin 12.4 (*)    HCT 37.8 (*)    All other components within normal limits  COMPREHENSIVE METABOLIC PANEL - Abnormal; Notable for the following components:   Calcium 8.6 (*)    Albumin 3.4 (*)    All other components within normal limits  SARS CORONAVIRUS 2 BY RT PCR  URINALYSIS, ROUTINE W REFLEX MICROSCOPIC     EKG  ED ECG REPORT I, Chesley Noon, the attending physician, personally viewed and interpreted this ECG.   Date: 01/30/2023  EKG Time: 20:20  Rate: 70  Rhythm: normal sinus rhythm  Axis: Normal  Intervals:none  ST&T Change: None  RADIOLOGY Chest x-ray reviewed and  interpreted by me with retrocardiac opacity, no edema or effusion noted.  PROCEDURES:  Critical Care performed: No  Procedures   MEDICATIONS ORDERED IN ED: Medications  amoxicillin-clavulanate (AUGMENTIN) 875-125 MG per tablet 1 tablet (has no administration in time range)  lactated ringers bolus 1,000 mL (1,000 mLs Intravenous New Bag/Given 01/30/23 2047)     IMPRESSION / MDM / ASSESSMENT AND PLAN / ED COURSE  I reviewed the triage vital signs and the nursing notes.                              77 y.o. male with past medical history of hypertension, hyperlipidemia, HIV, and anemia who presents to the ED complaining of generalized weakness, fatigue, and bodyaches over the past 3 days.  Patient's presentation is most consistent with acute presentation with potential threat to life or  bodily function.  Differential diagnosis includes, but is not limited to, sepsis, pneumonia, viral syndrome, COVID-19, anemia, electrolyte abnormality, AKI, UTI.  Patient nontoxic-appearing and in no acute distress, vital signs are unremarkable.  Symptoms sound most consistent with viral syndrome, low concern for bacterial illness and sepsis at this time.  However, given his history, we will check chest x-ray and urinalysis in addition to basic labs.  COVID testing is pending at this time, will hydrate with IV fluids and reassess.  COVID testing is negative, however chest x-ray concerning for retrocardiac opacity.  With his reported upper back pain, malaise, and cough I am concerned for pneumonia.  Labs are reassuring with no significant anemia, leukocytosis, lecture abnormality, or AKI.  LFTs are unremarkable.  Patient has been unable to provide urine sample however low suspicion for UTI at this time given no urinary symptoms.  He is appropriate for outpatient management given reassuring vital signs and no signs of sepsis, we will start on Augmentin.  He was counseled to follow-up with his PCP and to return to the ED for new or worsening symptoms, patient agrees with plan.      FINAL CLINICAL IMPRESSION(S) / ED DIAGNOSES   Final diagnoses:  Generalized weakness  Pneumonia due to infectious organism, unspecified laterality, unspecified part of lung     Rx / DC Orders   ED Discharge Orders          Ordered    amoxicillin-clavulanate (AUGMENTIN) 875-125 MG tablet  2 times daily        01/30/23 2221             Note:  This document was prepared using Dragon voice recognition software and may include unintentional dictation errors.   Chesley Noon, MD 01/30/23 2223

## 2023-01-30 NOTE — ED Triage Notes (Signed)
Pt to ed from home via POV for lower back pain and fever. Pt has been feeling bad since Monday. He has woken up in a cold sweat several times this week. Pt is caox4, in no acute distress and ambulatory in triage. Pt states "my tinnitus is louder than normal".

## 2023-01-31 LAB — URINALYSIS, ROUTINE W REFLEX MICROSCOPIC
Bilirubin Urine: NEGATIVE
Glucose, UA: NEGATIVE mg/dL
Hgb urine dipstick: NEGATIVE
Ketones, ur: 5 mg/dL — AB
Leukocytes,Ua: NEGATIVE
Nitrite: NEGATIVE
Protein, ur: NEGATIVE mg/dL
Specific Gravity, Urine: 1.021 (ref 1.005–1.030)
pH: 7 (ref 5.0–8.0)

## 2023-05-11 ENCOUNTER — Emergency Department
Admission: EM | Admit: 2023-05-11 | Discharge: 2023-05-11 | Disposition: A | Discharge status: Home / Self Care | Payer: Medicare HMO | Attending: Emergency Medicine | Admitting: Emergency Medicine

## 2023-05-11 ENCOUNTER — Emergency Department: Payer: Medicare HMO

## 2023-05-11 DIAGNOSIS — S4992XA Unspecified injury of left shoulder and upper arm, initial encounter: Secondary | ICD-10-CM | POA: Diagnosis present

## 2023-05-11 DIAGNOSIS — Z7901 Long term (current) use of anticoagulants: Secondary | ICD-10-CM | POA: Insufficient documentation

## 2023-05-11 DIAGNOSIS — W010XXA Fall on same level from slipping, tripping and stumbling without subsequent striking against object, initial encounter: Secondary | ICD-10-CM | POA: Insufficient documentation

## 2023-05-11 DIAGNOSIS — S42292A Other displaced fracture of upper end of left humerus, initial encounter for closed fracture: Secondary | ICD-10-CM | POA: Diagnosis not present

## 2023-05-11 MED ORDER — HYDROCODONE-ACETAMINOPHEN 5-325 MG PO TABS: 1.0000 | ORAL_TABLET | ORAL | 0 refills | Status: AC | PRN

## 2023-05-11 MED ORDER — HYDROCODONE-ACETAMINOPHEN 5-325 MG PO TABS
1.0000 | ORAL_TABLET | ORAL | Status: AC
  Administered 2023-05-11: 1 via ORAL
  Filled 2023-05-11: qty 1

## 2023-05-11 NOTE — Discharge Instructions (Signed)
Please apply ice 20 minutes every hour to the left shoulder.  Use a sling at all times.  Avoid shoulder range of motion.  Call orthopedic office to schedule follow-up appointment.  Take Norco as needed for moderate to severe pain.

## 2023-05-11 NOTE — ED Triage Notes (Signed)
Pt presents to the ED with left shoulder pain after fall. Pt states that he tripped. Denies hitting head or LOC. Pt A&Ox4 a time of triage. Just complaining of left shoulder pain at this time. Denies other injuries

## 2023-05-11 NOTE — ED Provider Notes (Signed)
[place] EMERGENCY DEPARTMENT AT [facility] Provider Note   CSN: [id number] Arrival date & time: 05/11/23  1533     History  Chief Complaint  Patient presents with   Jordan Cohen is a 77 y.o. male.  Presents to the emergency department for evaluation of left shoulder pain.  Patient was in his shop earlier today, tripped and fell and use his left arm to catch himself.  He developed pain and discomfort to the left proximal humerus.  Denies any head injury LOC nausea vomiting or headache.  Denies any other injury to his body.  HPI     Home Medications Prior to Admission medications   Medication Sig Start Date End Date Taking? Authorizing Provider  HYDROcodone-acetaminophen (NORCO) 5-325 MG tablet Take 1 tablet by mouth every 4 (four) hours as needed for moderate pain (pain score 4-6). 05/11/23  Yes Evon Slack, PA-C  acyclovir (ZOVIRAX) 400 MG tablet Take 400 mg by mouth daily. 03/05/17   [provider]  albuterol (VENTOLIN HFA) 108 (90 Base) MCG/ACT inhaler Inhale 2 puffs into the lungs every 6 (six) hours as needed for wheezing or shortness of breath.    [provider]  apixaban (ELIQUIS) 5 MG TABS tablet Take 5 mg by mouth 2 (two) times daily.    [provider]  bictegravir-emtricitabine-tenofovir AF (BIKTARVY) 50-200-25 MG TABS tablet Take 1 tablet by mouth daily.    [provider]  buPROPion (WELLBUTRIN XL) 300 MG 24 hr tablet Take 300 mg by mouth daily. 03/28/17   [provider]  cholecalciferol (VITAMIN D3) 25 MCG (1000 UNIT) tablet Take 1,000 Units by mouth daily.    [provider]  diazepam (VALIUM) 10 MG tablet Take 10 mg by mouth daily as needed for anxiety. 03/02/16   [provider]  diltiazem (TIAZAC) 240 MG 24 hr capsule Take 240 mg by mouth daily.    [provider]  esomeprazole (NEXIUM) 40 MG capsule Take 40 mg by mouth 2 (two) times daily before a meal. 03/08/17    [provider]  flecainide (TAMBOCOR) 100 MG tablet Take 100 mg by mouth 2 (two) times daily.    [provider]  gabapentin (NEURONTIN) 300 MG capsule Take 300-900 mg by mouth See admin instructions. 300 mg in the morning, 900 mg at bedtime 03/05/17   [provider]  loperamide (IMODIUM) 2 MG capsule Take 2 mg by mouth as needed for diarrhea or loose stools. 04/18/13   [provider]  Multiple Vitamins-Minerals (MULTIVITAMIN WITH MINERALS) tablet Take 1 tablet by mouth daily.    [provider]  pravastatin (PRAVACHOL) 40 MG tablet Take 40 mg by mouth daily. 03/05/17   [provider]  Probiotic Product (PROBIOTIC DAILY PO) Take 1 capsule by mouth daily.    [provider]  venlafaxine XR (EFFEXOR-XR) 150 MG 24 hr capsule Take 150 mg by mouth daily with breakfast.    [provider]      Allergies    Tetracycline, Atorvastatin, and Efavirenz-emtricitab-tenofo df    Review of Systems   Review of Systems  Physical Exam Updated Vital Signs BP (!) 179/93   Pulse 65   Temp 98 F (36.7 C) (Oral)   Resp 18   Ht 5\' 7"  (1.702 m)   Wt 78.5 kg   SpO2 100%   BMI 27.10 kg/m  Physical Exam Constitutional:      Appearance: He is well-developed.  HENT:  Head: Normocephalic and atraumatic.     Right Ear: External ear normal.     Left Ear: External ear normal.  Eyes:     Extraocular Movements: Extraocular movements intact.     Conjunctiva/sclera: Conjunctivae normal.     Pupils: Pupils are equal, round, and reactive to light.  Cardiovascular:     Rate and Rhythm: Normal rate.  Pulmonary:     Effort: Pulmonary effort is normal. No respiratory distress.  Musculoskeletal:        General: Normal range of motion.     Cervical back: Normal range of motion.     Comments: Tender to palpation along left proximal humerus.  No visible defect or palpable defect.  No significant swelling or skin breakdown noted.  Normal range  of motion of the elbow wrist and digits.  Nontender along the cervical thoracic or lumbar spinous process.  Skin:    General: Skin is warm.     Findings: No rash.  Neurological:     General: No focal deficit present.     Mental Status: He is alert and oriented to person, place, and time. Mental status is at baseline.     Cranial Nerves: No cranial nerve deficit.  Psychiatric:        Mood and Affect: Mood normal.        Behavior: Behavior normal.        Thought Content: Thought content normal.     ED Results / Procedures / Treatments   Labs (all labs ordered are listed, but only abnormal results are displayed) Labs Reviewed - No data to display  EKG None  Radiology DG Shoulder Left  Result Date: 05/11/2023 CLINICAL DATA:  Left shoulder pain after fall EXAM: LEFT SHOULDER - 3 VIEW COMPARISON:  None Available. FINDINGS: Comminuted fracture of the proximal humerus through the greater tuberosity with fracture lucency extending obliquely into the proximal humeral diaphysis. No acute dislocation. IMPRESSION: Comminuted fracture of the proximal humerus through the greater tuberosity with fracture lucency extending obliquely into the proximal humeral diaphysis. No substantial displacement of fracture fragments. Electronically Signed   By: Agustin Cree M.D.   On: 05/11/2023 16:59    Procedures Procedures    Medications Ordered in ED Medications  HYDROcodone-acetaminophen (NORCO/VICODIN) 5-325 MG per tablet 1 tablet (has no administration in time range)    ED Course/ Medical Decision Making/ A&P                                 Medical Decision Making Amount and/or Complexity of Data Reviewed Radiology: ordered.  Risk Prescription drug management.   77 year old male with left proximal humerus fracture.  X-rays ordered and independently reviewed by me today show no dislocation.  There is a avulsion fracture along the greater tuberosity with lucency extending along the neck and into  the proximal humeral shaft.  No significant displacement.  Patient is given Norco for pain and will use a sling as needed for comfort.  He will call orthopedic office to schedule follow-up appointment.  He understands signs symptoms return to the ER for. Final Clinical Impression(s) / ED Diagnoses Final diagnoses:  Other closed displaced fracture of proximal end of left humerus, initial encounter    Rx / DC Orders ED Discharge Orders          Ordered    HYDROcodone-acetaminophen (NORCO) 5-325 MG tablet  Every 4 hours PRN  05/11/23 1627              Evon Slack, PA-C 05/11/23 1704    Dionne Bucy, MD 05/11/23 2300

## 2023-06-11 ENCOUNTER — Other Ambulatory Visit: Payer: Self-pay | Admitting: Student

## 2023-06-11 DIAGNOSIS — S42435D Nondisplaced fracture (avulsion) of lateral epicondyle of left humerus, subsequent encounter for fracture with routine healing: Secondary | ICD-10-CM

## 2023-06-16 ENCOUNTER — Encounter: Payer: Self-pay | Admitting: Student

## 2023-06-22 ENCOUNTER — Inpatient Hospital Stay: Admission: RE | Admit: 2023-06-22 | Payer: Medicare HMO | Source: Ambulatory Visit

## 2023-09-28 ENCOUNTER — Other Ambulatory Visit: Payer: Self-pay

## 2023-09-28 MED ORDER — VENLAFAXINE HCL ER 150 MG PO CP24: 150.0000 mg | ORAL_CAPSULE | Freq: Every day | ORAL | 3 refills | Status: AC

## 2023-09-28 MED ORDER — BUPROPION HCL ER (XL) 300 MG PO TB24
300.0000 mg | ORAL_TABLET | Freq: Every day | ORAL | 3 refills | Status: AC
  Filled 2023-10-12: qty 90, 90d supply, fill #0
  Filled 2023-10-15: qty 30, 30d supply, fill #0
  Filled 2023-11-02 – 2023-11-09 (×2): qty 90, 90d supply, fill #0
  Filled 2024-01-28: qty 90, 90d supply, fill #1

## 2023-09-28 MED ORDER — VENLAFAXINE HCL ER 75 MG PO CP24: 75.0000 mg | ORAL_CAPSULE | Freq: Every day | ORAL | 3 refills | Status: AC

## 2023-09-28 MED ORDER — ALBUTEROL SULFATE HFA 108 (90 BASE) MCG/ACT IN AERS: 2.0000 | INHALATION_SPRAY | Freq: Four times a day (QID) | RESPIRATORY_TRACT | 1 refills | Status: AC | PRN

## 2023-09-28 MED ORDER — ACYCLOVIR 400 MG PO TABS
400.0000 mg | ORAL_TABLET | Freq: Every day | ORAL | 3 refills | Status: AC
  Filled 2023-09-30 – 2023-10-11 (×2): qty 90, 90d supply, fill #0

## 2023-09-28 MED ORDER — VENLAFAXINE HCL ER 75 MG PO CP24
75.0000 mg | ORAL_CAPSULE | Freq: Every day | ORAL | 3 refills | Status: AC
  Filled 2023-11-19: qty 90, 90d supply, fill #0
  Filled 2024-02-23: qty 90, 90d supply, fill #1
  Filled 2024-05-29: qty 90, 90d supply, fill #2

## 2023-09-28 MED ORDER — APIXABAN 5 MG PO TABS
5.0000 mg | ORAL_TABLET | Freq: Two times a day (BID) | ORAL | 3 refills | Status: DC
  Filled 2023-11-19: qty 60, 30d supply, fill #0
  Filled 2024-01-11: qty 60, 30d supply, fill #1

## 2023-09-28 MED ORDER — GABAPENTIN 300 MG PO CAPS
300.0000 mg | ORAL_CAPSULE | Freq: Every evening | ORAL | 1 refills | Status: AC
  Filled 2023-11-02: qty 270, 90d supply, fill #0

## 2023-09-28 MED ORDER — PRAVASTATIN SODIUM 40 MG PO TABS
40.0000 mg | ORAL_TABLET | Freq: Every evening | ORAL | 2 refills | Status: AC
  Filled 2023-09-30 – 2023-10-11 (×2): qty 100, 100d supply, fill #0
  Filled 2024-01-11 – 2024-06-17 (×2): qty 100, 100d supply, fill #1

## 2023-09-28 MED ORDER — VENLAFAXINE HCL ER 150 MG PO CP24
150.0000 mg | ORAL_CAPSULE | Freq: Every day | ORAL | 3 refills | Status: AC
  Filled 2023-11-19: qty 90, 90d supply, fill #0
  Filled 2024-02-23: qty 90, 90d supply, fill #1
  Filled 2024-05-29: qty 90, 90d supply, fill #2

## 2023-09-28 MED ORDER — LOSARTAN POTASSIUM 50 MG PO TABS
50.0000 mg | ORAL_TABLET | Freq: Every day | ORAL | 1 refills | Status: AC
  Filled 2023-09-28 – 2023-11-09 (×3): qty 100, 100d supply, fill #0

## 2023-09-28 MED ORDER — SODIUM FLUORIDE 1.1 % DT PSTE
PASTE | DENTAL | 3 refills | Status: AC
  Filled 2023-09-30 – 2023-10-11 (×3): qty 100, 30d supply, fill #0
  Filled 2024-05-22: qty 100, 30d supply, fill #1

## 2023-09-28 MED ORDER — ESOMEPRAZOLE MAGNESIUM 40 MG PO CPDR
40.0000 mg | DELAYED_RELEASE_CAPSULE | Freq: Two times a day (BID) | ORAL | 11 refills | Status: DC
  Filled 2023-09-30 – 2023-10-11 (×2): qty 60, 30d supply, fill #0
  Filled 2023-12-03: qty 60, 30d supply, fill #1
  Filled 2024-01-11: qty 60, 30d supply, fill #2
  Filled 2024-02-09: qty 60, 30d supply, fill #3
  Filled 2024-03-29: qty 60, 30d supply, fill #4

## 2023-09-30 ENCOUNTER — Other Ambulatory Visit: Payer: Self-pay

## 2023-10-11 ENCOUNTER — Other Ambulatory Visit: Payer: Self-pay

## 2023-10-12 ENCOUNTER — Other Ambulatory Visit: Payer: Self-pay

## 2023-10-12 MED ORDER — BIKTARVY 50-200-25 MG PO TABS
1.0000 | ORAL_TABLET | Freq: Every day | ORAL | 5 refills | Status: AC
  Filled 2023-10-12 – 2023-11-02 (×2): qty 30, 30d supply, fill #0
  Filled 2023-11-30: qty 30, 30d supply, fill #1
  Filled 2024-03-29 – 2024-04-11 (×3): qty 30, 30d supply, fill #0
  Filled 2024-05-03 – 2024-05-15 (×3): qty 30, 30d supply, fill #1
  Filled 2024-06-06 – 2024-06-14 (×3): qty 30, 30d supply, fill #2
  Filled 2024-07-07: qty 30, 30d supply, fill #3

## 2023-10-12 MED ORDER — LOSARTAN POTASSIUM 50 MG PO TABS
50.0000 mg | ORAL_TABLET | Freq: Every day | ORAL | 1 refills | Status: AC
  Filled 2023-10-12 (×2): qty 100, 100d supply, fill #0
  Filled 2023-10-15: qty 30, 30d supply, fill #0
  Filled 2023-11-02 – 2024-05-15 (×2): qty 100, 100d supply, fill #0

## 2023-10-12 MED ORDER — FLECAINIDE ACETATE 100 MG PO TABS
100.0000 mg | ORAL_TABLET | Freq: Two times a day (BID) | ORAL | 0 refills | Status: DC
  Filled 2023-10-12: qty 180, 90d supply, fill #0

## 2023-10-12 MED ORDER — PRAVASTATIN SODIUM 40 MG PO TABS
40.0000 mg | ORAL_TABLET | Freq: Every day | ORAL | 2 refills | Status: AC
  Filled 2023-10-12: qty 100, 100d supply, fill #0

## 2023-10-15 ENCOUNTER — Other Ambulatory Visit: Payer: Self-pay

## 2023-10-21 ENCOUNTER — Other Ambulatory Visit: Payer: Self-pay

## 2023-11-02 ENCOUNTER — Other Ambulatory Visit: Payer: Self-pay

## 2023-11-03 ENCOUNTER — Other Ambulatory Visit: Payer: Self-pay

## 2023-11-04 ENCOUNTER — Other Ambulatory Visit: Payer: Self-pay

## 2023-11-05 ENCOUNTER — Other Ambulatory Visit: Payer: Self-pay

## 2023-11-05 MED ORDER — METHYLPREDNISOLONE 4 MG PO TBPK
ORAL_TABLET | ORAL | 0 refills | Status: AC
  Filled 2023-11-05: qty 21, 6d supply, fill #0

## 2023-11-08 ENCOUNTER — Other Ambulatory Visit: Payer: Self-pay

## 2023-11-09 ENCOUNTER — Other Ambulatory Visit: Payer: Self-pay

## 2023-11-16 ENCOUNTER — Other Ambulatory Visit: Payer: Self-pay

## 2023-11-16 MED ORDER — BIKTARVY 50-200-25 MG PO TABS
1.0000 | ORAL_TABLET | Freq: Every day | ORAL | 11 refills | Status: AC
  Filled 2023-12-03: qty 30, 30d supply, fill #0
  Filled 2023-12-21 – 2023-12-23 (×3): qty 30, 30d supply, fill #1
  Filled 2024-01-19 – 2024-01-21 (×3): qty 30, 30d supply, fill #2
  Filled 2024-02-24 – 2024-03-06 (×2): qty 30, 30d supply, fill #3
  Filled 2024-03-29: qty 30, 30d supply, fill #0

## 2023-11-19 ENCOUNTER — Other Ambulatory Visit: Payer: Self-pay

## 2023-11-30 ENCOUNTER — Other Ambulatory Visit: Payer: Self-pay

## 2023-11-30 MED ORDER — PREDNISONE 10 MG PO TABS
ORAL_TABLET | ORAL | 0 refills | Status: AC
  Filled 2023-11-30: qty 45, 15d supply, fill #0

## 2023-12-03 ENCOUNTER — Other Ambulatory Visit: Payer: Self-pay

## 2023-12-14 ENCOUNTER — Other Ambulatory Visit: Payer: Self-pay

## 2023-12-21 ENCOUNTER — Other Ambulatory Visit: Payer: Self-pay

## 2023-12-23 ENCOUNTER — Other Ambulatory Visit: Payer: Self-pay

## 2023-12-23 NOTE — Progress Notes (Signed)
 Specialty Pharmacy Initiation Note   Yuji Arakelian is a 78 y.o. male who will be followed by the specialty pharmacy service for RxSp HIV    Review of administration, indication, effectiveness, safety, potential side effects, storage/disposable, and missed dose instructions occurred today for patient's specialty medication(s) Bictegravir-Emtricitab-Tenofov (Biktarvy )     Patient/Caregiver did not have any additional questions or concerns.   Patient's therapy is appropriate to: Continue (Pt been on therapy for over a year)    Goals Addressed             This Visit's Progress    Achieve Undetectable HIV Viral Load < 20   Worsening    Patient is not on track and worsening. Patient will work on increased adherence. Patient states he has been on therapy for years, transferring to us  from [place]. Patient sees provider at [facility]. Last labs from 11/16/23 showed viral load was 97 copies/mL. Previous labs were 11/17/22 and undetectable.         [facility] Specialty Pharmacist

## 2023-12-23 NOTE — Progress Notes (Signed)
 Specialty Pharmacy Initial Fill Coordination Note  Jordan Cohen is a 78 y.o. male contacted today regarding initial fill of specialty medication(s) Bictegravir-Emtricitab-Tenofov (Biktarvy )   Patient requested Delivery   Delivery date: 12/28/23   Verified address: [address], [place] [address]   Medication will be filled on 12/27/23.   Patient is aware of $0 copayment.

## 2023-12-27 ENCOUNTER — Other Ambulatory Visit: Payer: Self-pay

## 2023-12-30 ENCOUNTER — Other Ambulatory Visit: Payer: Self-pay

## 2023-12-30 MED ORDER — BUPROPION HCL ER (XL) 150 MG PO TB24
150.0000 mg | ORAL_TABLET | Freq: Every day | ORAL | 2 refills | Status: AC
  Filled 2023-12-30 – 2024-01-28 (×2): qty 90, 90d supply, fill #0
  Filled 2024-04-24 (×2): qty 90, 90d supply, fill #1

## 2024-01-10 ENCOUNTER — Other Ambulatory Visit: Payer: Self-pay

## 2024-01-11 ENCOUNTER — Other Ambulatory Visit: Payer: Self-pay

## 2024-01-12 ENCOUNTER — Other Ambulatory Visit: Payer: Self-pay

## 2024-01-12 MED ORDER — FLECAINIDE ACETATE 100 MG PO TABS
100.0000 mg | ORAL_TABLET | Freq: Two times a day (BID) | ORAL | 0 refills | Status: AC
  Filled 2024-01-12: qty 180, 90d supply, fill #0

## 2024-01-19 ENCOUNTER — Other Ambulatory Visit: Payer: Self-pay

## 2024-01-20 ENCOUNTER — Other Ambulatory Visit: Payer: Self-pay

## 2024-01-20 MED ORDER — FLUOROURACIL 5 % EX CREA
TOPICAL_CREAM | Freq: Two times a day (BID) | CUTANEOUS | 1 refills | Status: AC
  Filled 2024-01-20: qty 40, 30d supply, fill #0

## 2024-01-20 MED ORDER — CLOBETASOL PROPIONATE 0.05 % EX SHAM
MEDICATED_SHAMPOO | Freq: Three times a day (TID) | CUTANEOUS | 6 refills | Status: AC
Start: 2024-01-20 — End: ?
  Filled 2024-01-20: qty 118, 30d supply, fill #0
  Filled 2024-05-22: qty 118, 30d supply, fill #1

## 2024-01-21 ENCOUNTER — Other Ambulatory Visit: Payer: Self-pay

## 2024-01-21 NOTE — Progress Notes (Signed)
 Specialty Pharmacy Refill Coordination Note  Jordan Cohen is a 78 y.o. male contacted today regarding refills of specialty medication(s) Bictegravir-Emtricitab-Tenofov (Biktarvy )   Patient requested Delivery   Delivery date: 01/25/24   Verified address: [address] [place] [address]   Medication will be filled on 01/24/24.

## 2024-01-24 ENCOUNTER — Other Ambulatory Visit: Payer: Self-pay

## 2024-01-28 ENCOUNTER — Other Ambulatory Visit: Payer: Self-pay

## 2024-02-09 ENCOUNTER — Other Ambulatory Visit: Payer: Self-pay

## 2024-02-09 ENCOUNTER — Other Ambulatory Visit: Payer: Self-pay | Admitting: Student

## 2024-02-10 ENCOUNTER — Other Ambulatory Visit: Payer: Self-pay

## 2024-02-10 MED ORDER — ELIQUIS 5 MG PO TABS
5.0000 mg | ORAL_TABLET | Freq: Two times a day (BID) | ORAL | 0 refills | Status: DC
  Filled 2024-02-10 – 2024-02-21 (×2): qty 60, 30d supply, fill #0

## 2024-02-16 ENCOUNTER — Other Ambulatory Visit: Payer: Self-pay

## 2024-02-21 ENCOUNTER — Other Ambulatory Visit: Payer: Self-pay

## 2024-02-23 ENCOUNTER — Other Ambulatory Visit: Payer: Self-pay

## 2024-02-24 ENCOUNTER — Other Ambulatory Visit (HOSPITAL_COMMUNITY): Payer: Self-pay

## 2024-02-28 ENCOUNTER — Other Ambulatory Visit (HOSPITAL_COMMUNITY): Payer: Self-pay

## 2024-03-06 ENCOUNTER — Other Ambulatory Visit (HOSPITAL_COMMUNITY): Payer: Self-pay

## 2024-03-06 ENCOUNTER — Other Ambulatory Visit: Payer: Self-pay

## 2024-03-06 NOTE — Progress Notes (Signed)
 Specialty Pharmacy Refill Coordination Note  Jordan Cohen is a 78 y.o. male contacted today regarding refills of specialty medication(s) Bictegravir-Emtricitab-Tenofov (Biktarvy )   Patient requested Delivery   Delivery date: 03/07/24   Verified address: [address], [place] [address]   Medication will be filled on 03/06/24.

## 2024-03-13 ENCOUNTER — Other Ambulatory Visit: Payer: Self-pay

## 2024-03-13 MED ORDER — FLECAINIDE ACETATE 100 MG PO TABS
100.0000 mg | ORAL_TABLET | Freq: Two times a day (BID) | ORAL | 5 refills | Status: AC
  Filled 2024-03-13: qty 60, 30d supply, fill #0

## 2024-03-13 MED ORDER — ELIQUIS 5 MG PO TABS
5.0000 mg | ORAL_TABLET | Freq: Two times a day (BID) | ORAL | 11 refills | Status: AC
  Filled 2024-03-13 – 2024-06-14 (×2): qty 60, 30d supply, fill #0

## 2024-03-13 MED ORDER — DILTIAZEM HCL ER COATED BEADS 120 MG PO CP24
120.0000 mg | ORAL_CAPSULE | Freq: Every day | ORAL | 3 refills | Status: AC
  Filled 2024-03-13: qty 90, 90d supply, fill #0

## 2024-03-21 ENCOUNTER — Ambulatory Visit: Admission: RE | Admit: 2024-03-21 | Source: Home / Self Care | Admitting: Cardiology

## 2024-03-21 ENCOUNTER — Encounter: Payer: Self-pay | Admitting: Certified Registered"

## 2024-03-21 ENCOUNTER — Encounter: Admission: RE | Payer: Self-pay

## 2024-03-21 ENCOUNTER — Other Ambulatory Visit: Payer: Self-pay

## 2024-03-21 ENCOUNTER — Encounter: Payer: Self-pay | Admitting: Cardiology

## 2024-03-21 ENCOUNTER — Encounter
Admission: RE | Admit: 2024-03-21 | Discharge: 2024-03-21 | Disposition: A | Discharge status: Home / Self Care | Attending: Cardiology | Admitting: Cardiology

## 2024-03-21 ENCOUNTER — Ambulatory Visit: Admission: RE | Admit: 2024-03-21 | Source: Ambulatory Visit

## 2024-03-21 DIAGNOSIS — Z01810 Encounter for preprocedural cardiovascular examination: Secondary | ICD-10-CM | POA: Insufficient documentation

## 2024-03-21 DIAGNOSIS — I4891 Unspecified atrial fibrillation: Secondary | ICD-10-CM | POA: Diagnosis not present

## 2024-03-21 DIAGNOSIS — I480 Paroxysmal atrial fibrillation: Secondary | ICD-10-CM | POA: Diagnosis not present

## 2024-03-21 SURGERY — ECHOCARDIOGRAM, TRANSESOPHAGEAL
Anesthesia: General

## 2024-03-21 MED ORDER — SODIUM CHLORIDE 0.9 % IV SOLN: INTRAVENOUS | Status: AC

## 2024-03-21 NOTE — Anesthesia Preprocedure Evaluation (Addendum)
 Anesthesia Evaluation  Patient identified by MRN, date of birth, ID band Patient awake    Reviewed: Allergy & Precautions, NPO status , Patient's Chart, lab work & pertinent test results  Airway Mallampati: II  TM Distance: >3 FB Neck ROM: full    Dental  (+) Teeth Intact   Pulmonary neg pulmonary ROS, sleep apnea    Pulmonary exam normal breath sounds clear to auscultation       Cardiovascular Exercise Tolerance: Good hypertension, Normal cardiovascular exam+ dysrhythmias Atrial Fibrillation  Rhythm:Regular Rate:Abnormal     Neuro/Psych   Anxiety     negative neurological ROS  negative psych ROS   GI/Hepatic negative GI ROS, Neg liver ROS,GERD  ,,  Endo/Other  negative endocrine ROS    Renal/GU negative Renal ROS  negative genitourinary   Musculoskeletal   Abdominal   Peds negative pediatric ROS (+)  Hematology negative hematology ROS (+)   Anesthesia Other Findings Past Medical History: No date: AIDS (acquired immune deficiency syndrome) (HCC) No date: Anal warts No date: Anxiety No date: Arthritis No date: Basal cell carcinoma No date: Brachioradial pruritus No date: COVID-19 No date: Depression No date: DVT (deep vein thrombosis) in pregnancy No date: Esophageal spasm No date: GERD (gastroesophageal reflux disease) No date: History of anal cancer No date: HIV (human immunodeficiency virus infection) (HCC) No date: Hyperlipidemia No date: Hypertension No date: OSA (obstructive sleep apnea) No date: Osteopenia No date: Peripheral neuropathy No date: Squamous cell carcinoma of anus (HCC)  Past Surgical History: No date: ADENOIDECTOMY No date: ANUS SURGERY 07/02/2021: CARDIOVERSION; N/A     Comment:  Procedure: CARDIOVERSION;  Surgeon: Hester Wolm PARAS,               MD;  Location: [facility] ORS;  Service: Cardiovascular;                Laterality: N/A; No date: COLONOSCOPY No date: TONSILLECTOMY No  date: UPPER GASTROINTESTINAL ENDOSCOPY No date: WISDOM TOOTH EXTRACTION  BMI    Body Mass Index: 26.00 kg/m      Reproductive/Obstetrics negative OB ROS                              Anesthesia Physical Anesthesia Plan  ASA: 2  Anesthesia Plan: General   Post-op Pain Management:    Induction: Intravenous  PONV Risk Score and Plan: Propofol  infusion and TIVA  Airway Management Planned: Natural Airway and Nasal Cannula  Additional Equipment:   Intra-op Plan:   Post-operative Plan:   Informed Consent: I have reviewed the patients History and Physical, chart, labs and discussed the procedure including the risks, benefits and alternatives for the proposed anesthesia with the patient or authorized representative who has indicated his/her understanding and acceptance.     Dental Advisory Given  Plan Discussed with: CRNA  Anesthesia Plan Comments:          Anesthesia Quick Evaluation

## 2024-03-21 NOTE — Progress Notes (Signed)
 Pt. Noted to be in NSR. Seen by FABIENE Bloch, PA. MD made aware. Case cancelled for today as no need for cardioversion.

## 2024-03-23 ENCOUNTER — Other Ambulatory Visit: Payer: Self-pay

## 2024-03-28 ENCOUNTER — Other Ambulatory Visit: Payer: Self-pay

## 2024-03-28 MED ORDER — TRAZODONE HCL 100 MG PO TABS
100.0000 mg | ORAL_TABLET | Freq: Every day | ORAL | 0 refills | Status: AC
  Filled 2024-03-28: qty 90, 90d supply, fill #0

## 2024-03-29 ENCOUNTER — Other Ambulatory Visit: Payer: Self-pay

## 2024-03-30 ENCOUNTER — Other Ambulatory Visit: Payer: Self-pay

## 2024-03-30 MED ORDER — DIAZEPAM 5 MG PO TABS
5.0000 mg | ORAL_TABLET | Freq: Every day | ORAL | 0 refills | Status: AC | PRN
  Filled 2024-03-30: qty 30, 30d supply, fill #0

## 2024-03-31 ENCOUNTER — Other Ambulatory Visit: Payer: Self-pay

## 2024-04-04 ENCOUNTER — Other Ambulatory Visit: Payer: Self-pay

## 2024-04-11 ENCOUNTER — Other Ambulatory Visit: Payer: Self-pay

## 2024-04-11 NOTE — Progress Notes (Signed)
 Specialty Pharmacy Refill Coordination Note  Jordan Cohen is a 78 y.o. male contacted today regarding refills of specialty medication(s) Bictegravir-Emtricitab-Tenofov (Biktarvy )   Patient requested Delivery   Delivery date: 04/12/24   Verified address: [address], [place] [address]   Medication will be filled on: 04/11/24

## 2024-04-23 IMAGING — CT CT HEAD W/O CM
4 series · 16 of 47 positions shown, 18 images · non-contrast
Comparison: None Available.

CLINICAL DATA: Headache, dizziness.



[Series 2: head bone · axial · 0.41mm/px · z∈[-100,-68]mm · 3 of 79 slices shown]
[im 8/79  bone]
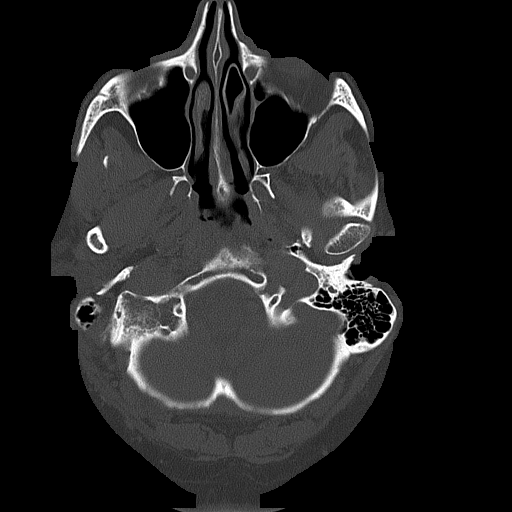
[im 16/79  bone]
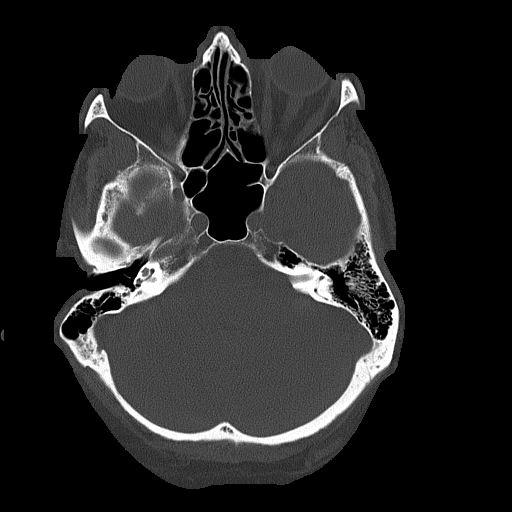
[im 24/79  bone]
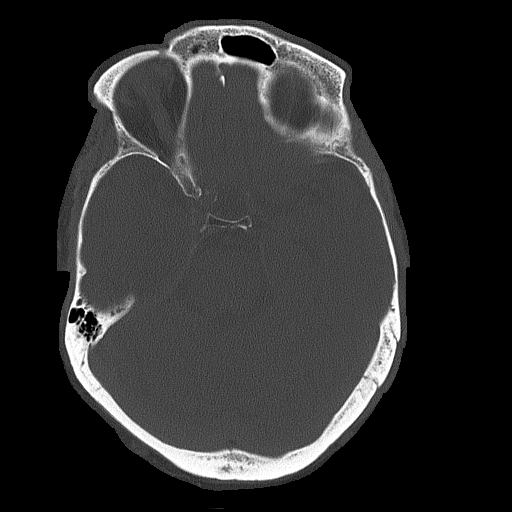

[Series 3: head wo · axial · 0.41mm/px · z∈[-99,+21]mm · 7 of 32 slices shown, 9 images]
[im 4/32  brain]
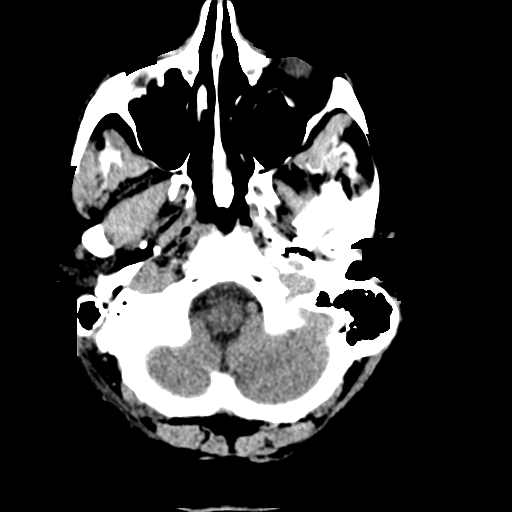
[im 4/32  bone]
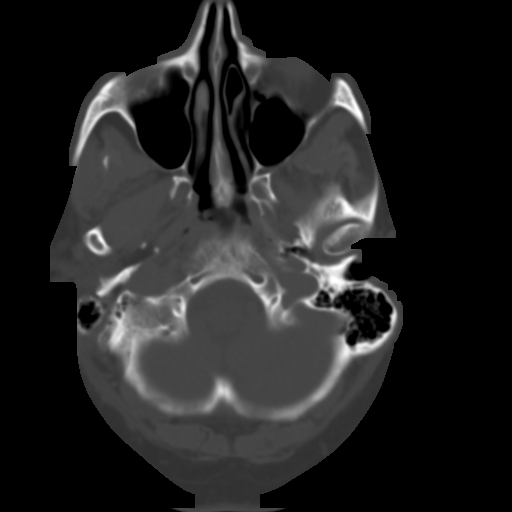
[im 8/32  brain]
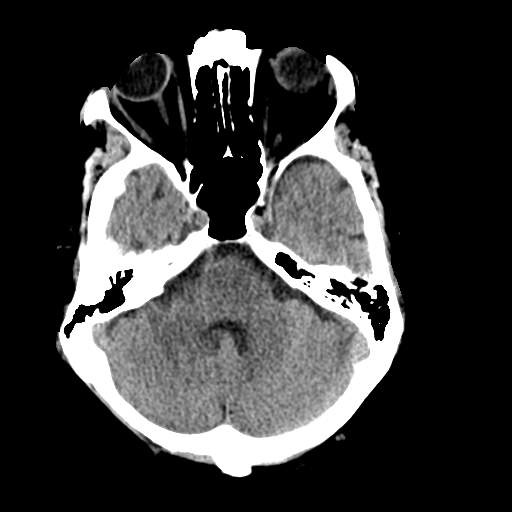
[im 12/32  brain]
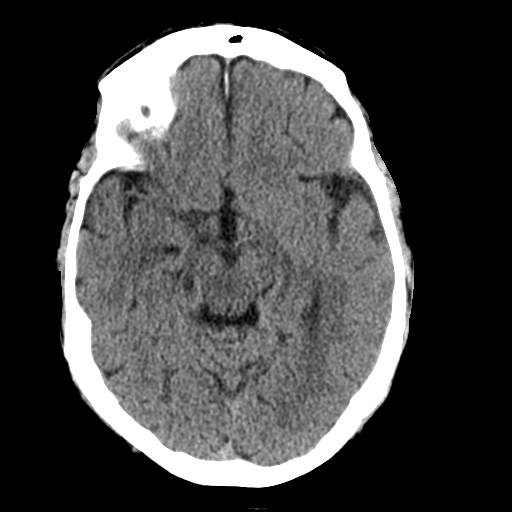
[im 16/32  brain]
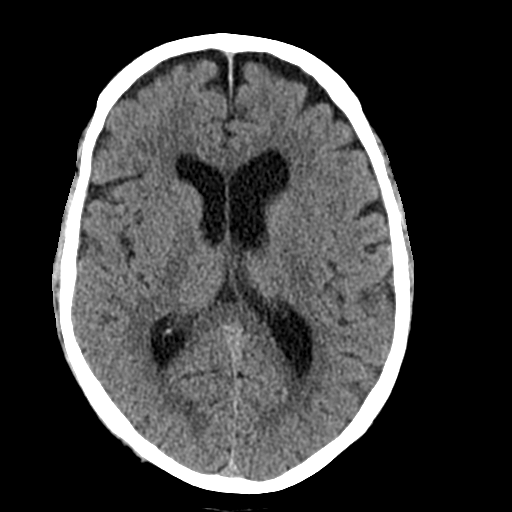
[im 20/32  brain]
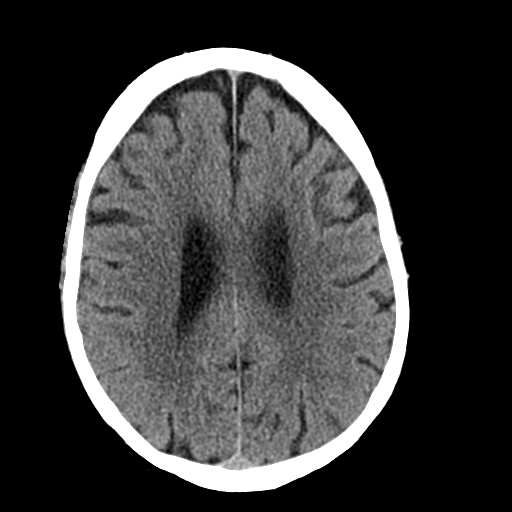
[im 20/32  bone]
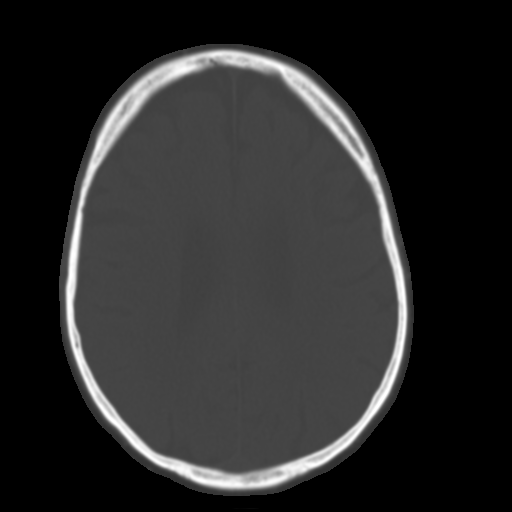
[im 24/32  brain]
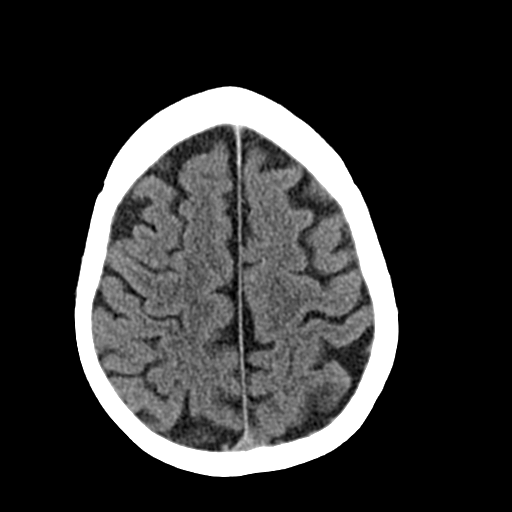
[im 28/32  brain]
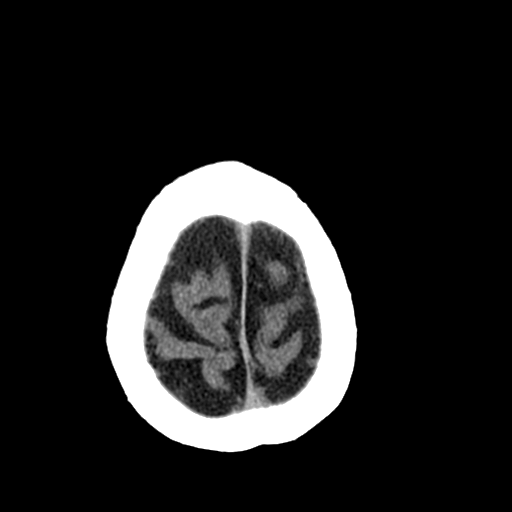

[Series 4: coronal soft tissue · coronal · 0.31mm/px · 3 of 72 slices shown]
[im 24/72  brain]
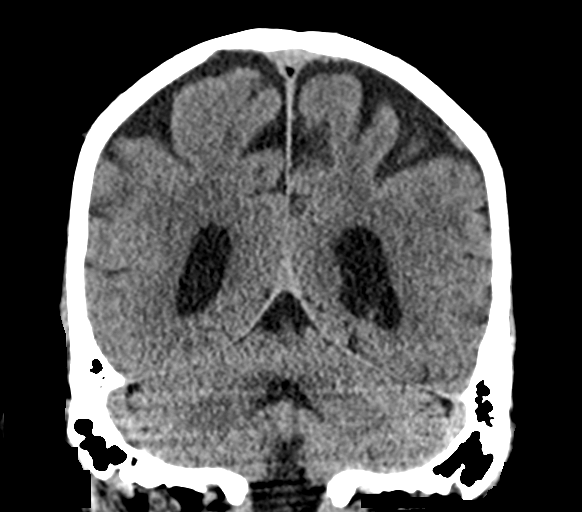
[im 32/72  brain]
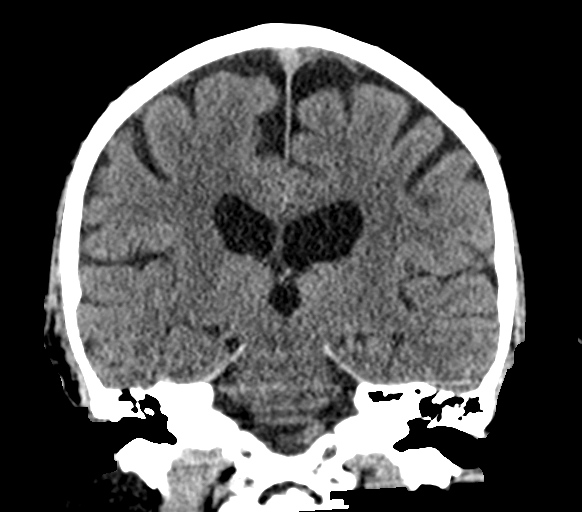
[im 40/72  brain]
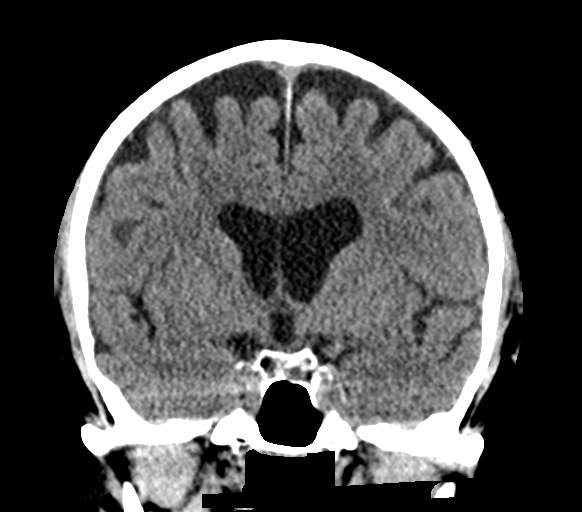

[Series 5: sagittal soft tissue · sagittal · 0.31mm/px · 3 of 59 slices shown]
[im 20/59  brain]
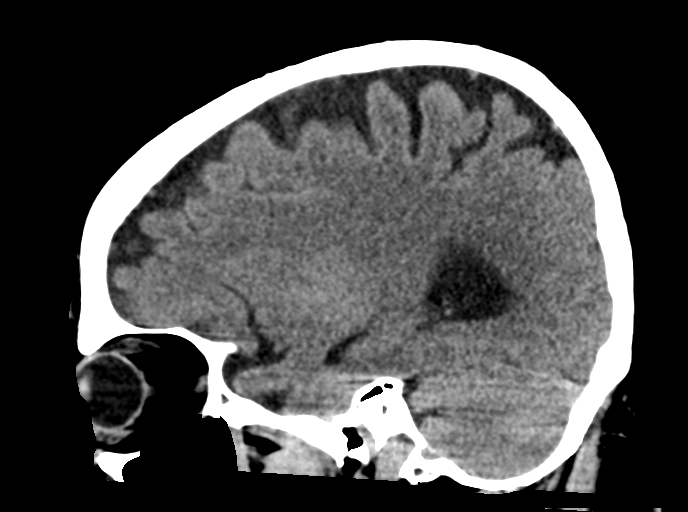
[im 30/59  brain]
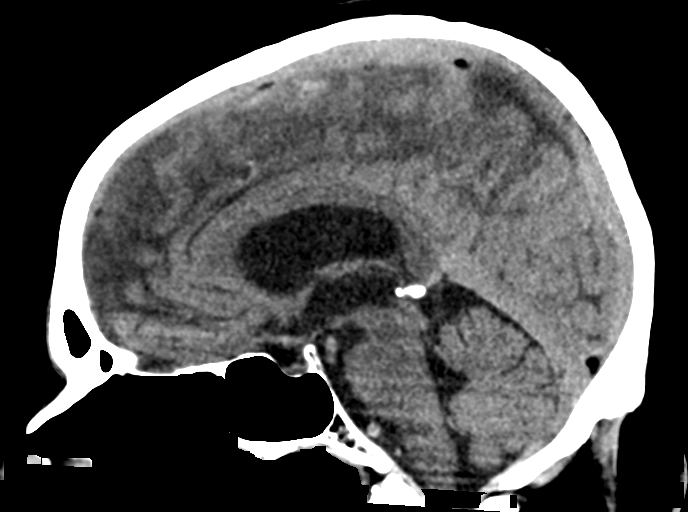
[im 39/59  brain]
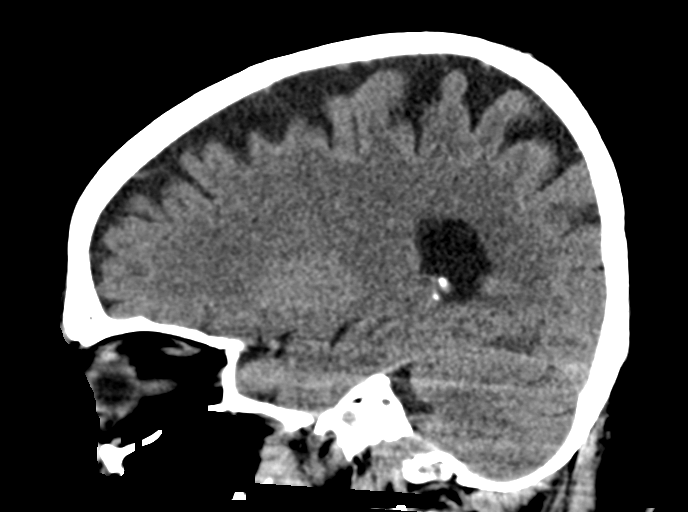

[16 of 47 positions shown; findings below may reference images not displayed]

FINDINGS: Brain: No evidence of acute infarction, hemorrhage, hydrocephalus,
extra-axial collection or mass lesion/mass effect.

Vascular: No hyperdense vessel or unexpected calcification.

Skull: Normal. Negative for fracture or focal lesion.

Sinuses/Orbits: No acute finding.

Other: None.
IMPRESSION: No acute intracranial abnormality seen.

## 2024-04-24 ENCOUNTER — Other Ambulatory Visit: Payer: Self-pay

## 2024-04-24 MED ORDER — ESOMEPRAZOLE MAGNESIUM 40 MG PO CPDR
40.0000 mg | DELAYED_RELEASE_CAPSULE | Freq: Two times a day (BID) | ORAL | 6 refills | Status: AC
  Filled 2024-04-24: qty 60, 30d supply, fill #0
  Filled 2024-06-14: qty 60, 30d supply, fill #1

## 2024-04-25 ENCOUNTER — Other Ambulatory Visit: Payer: Self-pay

## 2024-05-03 ENCOUNTER — Other Ambulatory Visit: Payer: Self-pay

## 2024-05-03 ENCOUNTER — Other Ambulatory Visit (HOSPITAL_COMMUNITY): Payer: Self-pay

## 2024-05-05 ENCOUNTER — Other Ambulatory Visit: Payer: Self-pay

## 2024-05-08 ENCOUNTER — Other Ambulatory Visit: Payer: Self-pay

## 2024-05-10 ENCOUNTER — Other Ambulatory Visit (HOSPITAL_COMMUNITY): Payer: Self-pay

## 2024-05-15 ENCOUNTER — Other Ambulatory Visit: Payer: Self-pay

## 2024-05-15 NOTE — Progress Notes (Signed)
 Specialty Pharmacy Refill Coordination Note  Jordan Cohen is a 78 y.o. male contacted today regarding refills of specialty medication(s) Bictegravir-Emtricitab-Tenofov (Biktarvy )   Patient requested Delivery   Delivery date: 05/17/24   Verified address: [address] [place] [address]   Medication will be filled on: 05/16/24

## 2024-05-22 ENCOUNTER — Other Ambulatory Visit: Payer: Self-pay

## 2024-05-22 MED ORDER — PREVIDENT 5000 BOOSTER PLUS 1.1 % DT PSTE
PASTE | DENTAL | 3 refills | Status: AC
  Filled 2024-05-22: qty 100, 30d supply, fill #0

## 2024-05-29 ENCOUNTER — Other Ambulatory Visit: Payer: Self-pay

## 2024-06-06 ENCOUNTER — Other Ambulatory Visit (HOSPITAL_COMMUNITY): Payer: Self-pay

## 2024-06-07 ENCOUNTER — Other Ambulatory Visit: Payer: Self-pay

## 2024-06-09 ENCOUNTER — Other Ambulatory Visit: Payer: Self-pay

## 2024-06-12 ENCOUNTER — Other Ambulatory Visit: Payer: Self-pay

## 2024-06-14 ENCOUNTER — Other Ambulatory Visit: Payer: Self-pay

## 2024-06-14 NOTE — Progress Notes (Signed)
 Specialty Pharmacy Ongoing Clinical Assessment Note  Jordan Cohen is a 79 y.o. male who is being followed by the specialty pharmacy service for RxSp HIV   Patient's specialty medication(s) reviewed today: Bictegravir-Emtricitab-Tenofov (Biktarvy )   Missed doses in the last 4 weeks: 0   Patient/Caregiver did not have any additional questions or concerns.   Therapeutic benefit summary: Patient is achieving benefit   Adverse events/side effects summary: No adverse events/side effects   Patient's therapy is appropriate to: Continue    Goals Addressed             This Visit's Progress    Achieve Undetectable HIV Viral Load < 20   On track    Patient is on track. Patient will maintain adherence. Patient's viral load was undetectable as of 05/16/24         Follow up: 12 months  [facility] Specialty Pharmacist

## 2024-06-14 NOTE — Progress Notes (Signed)
 Specialty Pharmacy Refill Coordination Note  Jordan Cohen is a 79 y.o. male contacted today regarding refills of specialty medication(s) Bictegravir-Emtricitab-Tenofov (Biktarvy )   Patient requested Delivery   Delivery date: 06/16/24   Verified address: [address], [place] [address]   Medication will be filled on: 06/15/24

## 2024-06-15 ENCOUNTER — Other Ambulatory Visit: Payer: Self-pay

## 2024-06-17 ENCOUNTER — Other Ambulatory Visit: Payer: Self-pay

## 2024-07-07 ENCOUNTER — Other Ambulatory Visit: Payer: Self-pay

## 2024-07-11 ENCOUNTER — Other Ambulatory Visit: Payer: Self-pay

## 2024-07-13 ENCOUNTER — Other Ambulatory Visit (HOSPITAL_COMMUNITY): Payer: Self-pay

## 2024-07-14 ENCOUNTER — Other Ambulatory Visit: Payer: Self-pay

## 2024-07-14 MED ORDER — HYDROCODONE-ACETAMINOPHEN 5-325 MG PO TABS
1.0000 | ORAL_TABLET | Freq: Four times a day (QID) | ORAL | 0 refills | Status: AC | PRN
  Filled 2024-07-14: qty 20, 5d supply, fill #0

## 2024-07-14 MED ORDER — PREDNISONE 10 MG PO TABS
ORAL_TABLET | ORAL | 0 refills | Status: AC
  Filled 2024-07-14: qty 15, 10d supply, fill #0
